# Patient Record
Sex: Male | Born: 1980 | Race: White | Hispanic: No | Marital: Married | Smoking: Light tobacco smoker
Health system: Southern US, Community
[De-identification: ages and names within clinical notes are randomized; demographics above are authoritative.]

## PROBLEM LIST (undated history)

## (undated) ENCOUNTER — Emergency Department (HOSPITAL_COMMUNITY): Admission: EM | Payer: Self-pay | Source: Home / Self Care

## (undated) DIAGNOSIS — I2510 Atherosclerotic heart disease of native coronary artery without angina pectoris: Secondary | ICD-10-CM

## (undated) DIAGNOSIS — Z789 Other specified health status: Secondary | ICD-10-CM

## (undated) DIAGNOSIS — Z720 Tobacco use: Secondary | ICD-10-CM

## (undated) DIAGNOSIS — I10 Essential (primary) hypertension: Secondary | ICD-10-CM

## (undated) DIAGNOSIS — E785 Hyperlipidemia, unspecified: Secondary | ICD-10-CM

## (undated) DIAGNOSIS — F1090 Alcohol use, unspecified, uncomplicated: Secondary | ICD-10-CM

## (undated) DIAGNOSIS — Z7289 Other problems related to lifestyle: Secondary | ICD-10-CM

## (undated) DIAGNOSIS — J449 Chronic obstructive pulmonary disease, unspecified: Secondary | ICD-10-CM

## (undated) HISTORY — DX: Tobacco use: Z72.0

## (undated) HISTORY — PX: KNEE SURGERY: SHX244

## (undated) HISTORY — DX: Atherosclerotic heart disease of native coronary artery without angina pectoris: I25.10

## (undated) HISTORY — DX: Alcohol use, unspecified, uncomplicated: F10.90

## (undated) HISTORY — DX: Hyperlipidemia, unspecified: E78.5

## (undated) HISTORY — DX: Other problems related to lifestyle: Z72.89

## (undated) HISTORY — DX: Other specified health status: Z78.9

## (undated) HISTORY — PX: HAND SURGERY: SHX662

---
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
## 1999-10-11 ENCOUNTER — Emergency Department (HOSPITAL_COMMUNITY): Admission: EM | Admit: 1999-10-11 | Discharge: 1999-10-11 | Payer: Self-pay | Admitting: Emergency Medicine

## 2000-05-15 ENCOUNTER — Emergency Department (HOSPITAL_COMMUNITY): Admission: EM | Admit: 2000-05-15 | Discharge: 2000-05-15 | Payer: Self-pay

## 2000-05-30 ENCOUNTER — Emergency Department (HOSPITAL_COMMUNITY): Admission: EM | Admit: 2000-05-30 | Discharge: 2000-05-30 | Payer: Self-pay | Admitting: *Deleted

## 2000-08-10 ENCOUNTER — Emergency Department (HOSPITAL_COMMUNITY): Admission: EM | Admit: 2000-08-10 | Discharge: 2000-08-10 | Payer: Self-pay | Admitting: *Deleted

## 2001-01-31 ENCOUNTER — Emergency Department (HOSPITAL_COMMUNITY): Admission: EM | Admit: 2001-01-31 | Discharge: 2001-01-31 | Payer: Self-pay | Admitting: Emergency Medicine

## 2001-03-29 ENCOUNTER — Encounter: Payer: Self-pay | Admitting: Emergency Medicine

## 2001-03-29 ENCOUNTER — Emergency Department (HOSPITAL_COMMUNITY): Admission: EM | Admit: 2001-03-29 | Discharge: 2001-03-29 | Payer: Self-pay | Admitting: Emergency Medicine

## 2001-09-11 ENCOUNTER — Emergency Department (HOSPITAL_COMMUNITY): Admission: EM | Admit: 2001-09-11 | Discharge: 2001-09-11 | Payer: Self-pay | Admitting: Emergency Medicine

## 2001-10-08 ENCOUNTER — Emergency Department (HOSPITAL_COMMUNITY): Admission: EM | Admit: 2001-10-08 | Discharge: 2001-10-08 | Payer: Self-pay

## 2001-10-08 ENCOUNTER — Encounter: Payer: Self-pay | Admitting: Emergency Medicine

## 2011-06-14 ENCOUNTER — Emergency Department (HOSPITAL_COMMUNITY): Payer: Self-pay

## 2011-06-14 ENCOUNTER — Emergency Department (HOSPITAL_COMMUNITY)
Admission: EM | Admit: 2011-06-14 | Discharge: 2011-06-14 | Disposition: A | Discharge status: Home / Self Care | Payer: Self-pay | Attending: Emergency Medicine | Admitting: Emergency Medicine

## 2011-06-14 ENCOUNTER — Encounter (HOSPITAL_COMMUNITY): Payer: Self-pay | Admitting: *Deleted

## 2011-06-14 DIAGNOSIS — R05 Cough: Secondary | ICD-10-CM | POA: Insufficient documentation

## 2011-06-14 DIAGNOSIS — J40 Bronchitis, not specified as acute or chronic: Secondary | ICD-10-CM | POA: Insufficient documentation

## 2011-06-14 DIAGNOSIS — K089 Disorder of teeth and supporting structures, unspecified: Secondary | ICD-10-CM | POA: Insufficient documentation

## 2011-06-14 DIAGNOSIS — K0889 Other specified disorders of teeth and supporting structures: Secondary | ICD-10-CM

## 2011-06-14 DIAGNOSIS — R059 Cough, unspecified: Secondary | ICD-10-CM | POA: Insufficient documentation

## 2011-06-14 DIAGNOSIS — K029 Dental caries, unspecified: Secondary | ICD-10-CM | POA: Insufficient documentation

## 2011-06-14 MED ORDER — HYDROCODONE-ACETAMINOPHEN 5-325 MG PO TABS: ORAL_TABLET | ORAL | Status: AC

## 2011-06-14 MED ORDER — ALBUTEROL SULFATE HFA 108 (90 BASE) MCG/ACT IN AERS
2.0000 | INHALATION_SPRAY | RESPIRATORY_TRACT | Status: AC
  Administered 2011-06-14: 2 via RESPIRATORY_TRACT
  Filled 2011-06-14: qty 6.7

## 2011-06-14 MED ORDER — ERYTHROMYCIN BASE 250 MG PO TABS: 250.0000 mg | ORAL_TABLET | Freq: Four times a day (QID) | ORAL | Status: AC

## 2011-06-14 NOTE — ED Notes (Signed)
approx one year ago had surgical removel of right lower molar. Gradual onset X5 days of pain in the area of tooth removed. Also c/o cough X2weeks causing muscle pain right abdomen. Cough keep awake at night, sinus drainage also present.

## 2011-06-14 NOTE — ED Provider Notes (Signed)
History     CSN: [id number]  Arrival date & time 06/14/11  1115   Chief Complaint  Patient presents with  . Dental Pain    where lost tooth  . Cough     HPI Pt was seen at 1210.  Per pt, c/o gradual onset and persistence of constant right lower tooth "pain" for the past several days.  Denies fevers, no intra-oral edema, no rash, no facial swelling, no dysphagia, no neck pain.   The condition is aggravated by nothing. The condition is relieved by nothing. The patient has no significant history of serious medical conditions.   Pt also c/o gradual onset and persistence of constant cough and runny/stuffy nose for the past 2 weeks.  Denies fevers, no rash, no SOB/wheezing, no CP/palpitations, no back pain, no abd pain, no N/V/D.    No past medical history on file.  No past surgical history on file.   History  Substance Use Topics  . Smoking status: Not on file  . Smokeless tobacco: Not on file  . Alcohol Use: Not on file     Review of Systems ROS: Statement: All systems negative except as marked or noted in the HPI; Constitutional: Negative for fever and chills. ; ; Eyes: Negative for eye pain and discharge. ; ; ENMT: Positive for dental caries, dental hygiene poor and toothache. +runny/stuffy nose, sinus congestion.  Negative for ear pain, bleeding gums, dental injury, facial deformity, facial swelling, hoarseness, sore throat, throat swelling and tongue swollen. ; ; Cardiovascular: Negative for chest pain, palpitations, diaphoresis, dyspnea and peripheral edema. ; ; Respiratory: +cough. Negative for wheezing and stridor. ; ; Gastrointestinal: Negative for nausea, vomiting, diarrhea and abdominal pain. ; ; Genitourinary: Negative for dysuria, flank pain and hematuria. ; ; Musculoskeletal: Negative for back pain and neck pain. ; ; Skin: Negative for rash and skin lesion. ; ; Neuro: Negative for headache, lightheadedness and neck stiffness.    Allergies  Penicillins and Vicodin  Home  Medications   Current Outpatient Rx  Name Route Sig Dispense Refill  . ALBUTEROL SULFATE HFA 108 (90 BASE) MCG/ACT IN AERS Inhalation Inhale 2 puffs into the lungs every 4 (four) hours as needed. For shortness of breath.    Marland Kitchen DIPHENHYDRAMINE HCL 25 MG PO TABS Oral Take 50 mg by mouth every 6 (six) hours as needed. For congestion.      There were no vitals taken for this visit.  Physical Exam 1215: Physical examination: Vital signs and O2 SAT: Reviewed; Constitutional: Well developed, Well nourished, Well hydrated, In no acute distress; Head and Face: Normocephalic, Atraumatic; Eyes: EOMI, PERRL, No scleral icterus; ENMT: Mouth and pharynx normal, Poor dentition, Widespread dental decay, Left TM normal, Right TM normal, Mucous membranes moist, +upper right 1st molar and lower right 2nd molar with dental decay, No gingival erythema, edema, fluctuance, or drainage.  No hoarse voice, no drooling, no stridor.  ; Neck: Supple, Full range of motion, No lymphadenopathy; Cardiovascular: Regular rate and rhythm, No murmur, rub, or gallop; Respiratory: Breath sounds clear & equal bilaterally, No rales, rhonchi, wheezes, or rub, Normal respiratory effort/excursion; Chest: Nontender, Movement normal; Extremities: Pulses normal, No tenderness, No edema; Neuro: AA&Ox3, Major CN grossly intact.  No gross focal motor or sensory deficits in extremities.; Skin: Color normal, No rash, No petechiae, Warm, Dry.   ED Course  Procedures   MDM  MDM Reviewed: nursing note and vitals Interpretation: x-ray   Dg Chest 2 View 06/14/2011  *RADIOLOGY REPORT*  Clinical  Data: Cough and congestion  CHEST - 2 VIEW  Comparison: None.  Findings: Normal mediastinum and cardiac silhouette.  Normal pulmonary  vasculature.  No evidence of effusion, infiltrate, or pneumothorax.  No acute bony abnormality.  IMPRESSION: No acute cardiopulmonary process.  Original Report Authenticated By: Genevive Bi, M.D.      1:26 PM:  MDI  given/teach and treat here.  Dx testing d/w pt.  Questions answered.  Verb understanding, agreeable to d/c home with outpt f/u.     Deloras Reichard Allison Quarry, DO 06/14/11 1942

## 2012-06-20 ENCOUNTER — Encounter (HOSPITAL_COMMUNITY): Payer: Self-pay | Admitting: Emergency Medicine

## 2012-06-20 ENCOUNTER — Emergency Department (HOSPITAL_COMMUNITY)
Admission: EM | Admit: 2012-06-20 | Discharge: 2012-06-20 | Disposition: A | Discharge status: Home / Self Care | Payer: Self-pay | Attending: Emergency Medicine | Admitting: Emergency Medicine

## 2012-06-20 ENCOUNTER — Emergency Department (HOSPITAL_COMMUNITY): Payer: Self-pay

## 2012-06-20 DIAGNOSIS — W010XXA Fall on same level from slipping, tripping and stumbling without subsequent striking against object, initial encounter: Secondary | ICD-10-CM | POA: Insufficient documentation

## 2012-06-20 DIAGNOSIS — S298XXA Other specified injuries of thorax, initial encounter: Secondary | ICD-10-CM | POA: Insufficient documentation

## 2012-06-20 DIAGNOSIS — Z79899 Other long term (current) drug therapy: Secondary | ICD-10-CM | POA: Insufficient documentation

## 2012-06-20 DIAGNOSIS — S299XXA Unspecified injury of thorax, initial encounter: Secondary | ICD-10-CM

## 2012-06-20 DIAGNOSIS — Y9289 Other specified places as the place of occurrence of the external cause: Secondary | ICD-10-CM | POA: Insufficient documentation

## 2012-06-20 DIAGNOSIS — Y9323 Activity, snow (alpine) (downhill) skiing, snow boarding, sledding, tobogganing and snow tubing: Secondary | ICD-10-CM | POA: Insufficient documentation

## 2012-06-20 DIAGNOSIS — F17200 Nicotine dependence, unspecified, uncomplicated: Secondary | ICD-10-CM | POA: Insufficient documentation

## 2012-06-20 MED ORDER — OXYCODONE-ACETAMINOPHEN 5-325 MG PO TABS: 2.0000 | ORAL_TABLET | Freq: Once | ORAL | Status: DC

## 2012-06-20 MED ORDER — OXYCODONE-ACETAMINOPHEN 5-325 MG PO TABS: 2.0000 | ORAL_TABLET | ORAL | Status: DC | PRN

## 2012-06-20 NOTE — ED Notes (Signed)
Patient transported to X-ray 

## 2012-06-20 NOTE — ED Provider Notes (Signed)
History     CSN: [id number]  Arrival date & time 06/20/12  4098   First MD Initiated Contact with Patient 06/20/12 1008      No chief complaint on file.   (Consider location/radiation/quality/duration/timing/severity/associated sxs/prior treatment) HPI Comments: Patient is a 32 year old male who presents with a 4 day history of right rib pain. The pain started suddenly after slipping and falling when he was sledding. The pain is aching and severe without radiation. Patient reports taking ibuprofen at home without relief. Deep inspiration and palpation make the pain worse. Nothing makes the pain better. No associated symptoms.    History reviewed. No pertinent past medical history.  Past Surgical History  Procedure Date  . Hand surgery     ORIF right ring finger  . Knee surgery     arthroscopy right knee    No family history on file.  History  Substance Use Topics  . Smoking status: Current Every Day Smoker -- 1.0 packs/day  . Smokeless tobacco: Not on file  . Alcohol Use: 0.6 oz/week    1 Cans of beer per week      Review of Systems  Respiratory:       Chest tenderness  All other systems reviewed and are negative.    Allergies  Penicillins and Vicodin  Home Medications   Current Outpatient Rx  Name  Route  Sig  Dispense  Refill  . BC HEADACHE POWDER PO   Oral   Take 2 packets by mouth 3 (three) times daily as needed. For pain.         . ALBUTEROL SULFATE HFA 108 (90 BASE) MCG/ACT IN AERS   Inhalation   Inhale 2 puffs into the lungs every 6 (six) hours as needed. For shortness of breath.           BP 128/86  Pulse 85  Temp 97.6 F (36.4 C) (Oral)  Resp 16  SpO2 97%  Physical Exam  Nursing note and vitals reviewed. Constitutional: He appears well-developed and well-nourished. No distress.  HENT:  Head: Normocephalic and atraumatic.  Eyes: Conjunctivae normal are normal.  Neck: Normal range of motion. Neck supple.  Cardiovascular: Normal  rate and regular rhythm.  Exam reveals no gallop and no friction rub.   No murmur heard. Pulmonary/Chest: Effort normal and breath sounds normal. He has no wheezes. He has no rales. He exhibits tenderness.       Right anterior rib tenderness to palpation. No obvious deformity or bruising noted.   Abdominal: Soft. There is no tenderness.  Musculoskeletal: Normal range of motion.  Neurological: He is alert.       Speech is goal-oriented. Moves limbs without ataxia.   Skin: Skin is warm and dry.  Psychiatric: He has a normal mood and affect. His behavior is normal.    ED Course  Procedures (including critical care time)  Labs Reviewed - No data to display Dg Ribs Unilateral W/chest Right  06/20/2012  *RADIOLOGY REPORT*  Clinical Data: Anterior rib pain after fall.  RIGHT RIBS AND CHEST - 3+ VIEW  Comparison: 06/14/2011  Findings: The lungs are clear without focal consolidation, edema, effusion or pneumothorax.  Cardiopericardial silhouette is within normal limits for size.  Imaged bony structures of the thorax are intact.  Oblique views of the right ribs show no evidence for a displaced rib fracture.  IMPRESSION: Normal exam.  No evidence for an acute right-sided rib fracture.   Original Report Authenticated By: Kennith Center, M.D.  1. Rib injury       MDM  10:50 AM Xray unremarkable for fracture. Patient will have Percocet for pain. No further evaluation needed at this time.         Emilia Beck, PA-C 06/22/12 0730

## 2012-06-20 NOTE — ED Notes (Signed)
Pt fell on right chest while sledding last week, onto concrete. C/o right anterior-lateral rib pain. C/o pain with deep breathing and cough which has worsened over the past few days.

## 2012-06-24 NOTE — ED Provider Notes (Signed)
Medical screening examination/treatment/procedure(s) were performed by non-physician practitioner and as supervising physician I was immediately available for consultation/collaboration.   Flint Melter, MD 06/24/12 1650

## 2013-03-31 ENCOUNTER — Emergency Department (HOSPITAL_COMMUNITY)
Admission: EM | Admit: 2013-03-31 | Discharge: 2013-03-31 | Disposition: A | Discharge status: Home / Self Care | Payer: No Typology Code available for payment source | Attending: Emergency Medicine | Admitting: Emergency Medicine

## 2013-03-31 ENCOUNTER — Emergency Department (HOSPITAL_COMMUNITY): Payer: No Typology Code available for payment source

## 2013-03-31 ENCOUNTER — Encounter (HOSPITAL_COMMUNITY): Payer: Self-pay | Admitting: Emergency Medicine

## 2013-03-31 DIAGNOSIS — Z79899 Other long term (current) drug therapy: Secondary | ICD-10-CM | POA: Insufficient documentation

## 2013-03-31 DIAGNOSIS — F17200 Nicotine dependence, unspecified, uncomplicated: Secondary | ICD-10-CM | POA: Insufficient documentation

## 2013-03-31 DIAGNOSIS — S92251A Displaced fracture of navicular [scaphoid] of right foot, initial encounter for closed fracture: Secondary | ICD-10-CM

## 2013-03-31 DIAGNOSIS — IMO0002 Reserved for concepts with insufficient information to code with codable children: Secondary | ICD-10-CM | POA: Insufficient documentation

## 2013-03-31 DIAGNOSIS — Y92410 Unspecified street and highway as the place of occurrence of the external cause: Secondary | ICD-10-CM | POA: Insufficient documentation

## 2013-03-31 DIAGNOSIS — S92253A Displaced fracture of navicular [scaphoid] of unspecified foot, initial encounter for closed fracture: Secondary | ICD-10-CM | POA: Insufficient documentation

## 2013-03-31 DIAGNOSIS — Z880 Allergy status to penicillin: Secondary | ICD-10-CM | POA: Insufficient documentation

## 2013-03-31 DIAGNOSIS — S92109A Unspecified fracture of unspecified talus, initial encounter for closed fracture: Secondary | ICD-10-CM | POA: Insufficient documentation

## 2013-03-31 DIAGNOSIS — S92101A Unspecified fracture of right talus, initial encounter for closed fracture: Secondary | ICD-10-CM

## 2013-03-31 DIAGNOSIS — Y9389 Activity, other specified: Secondary | ICD-10-CM | POA: Insufficient documentation

## 2013-03-31 DIAGNOSIS — Z9889 Other specified postprocedural states: Secondary | ICD-10-CM | POA: Insufficient documentation

## 2013-03-31 MED ORDER — IBUPROFEN 400 MG PO TABS
800.0000 mg | ORAL_TABLET | Freq: Once | ORAL | Status: AC
  Administered 2013-03-31: 800 mg via ORAL
  Filled 2013-03-31: qty 2

## 2013-03-31 MED ORDER — OXYCODONE-ACETAMINOPHEN 5-325 MG PO TABS: 1.0000 | ORAL_TABLET | ORAL | Status: DC | PRN
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
## 2013-03-31 NOTE — ED Provider Notes (Signed)
  Medical screening examination/treatment/procedure(s) were performed by non-physician practitioner and as supervising physician I was immediately available for consultation/collaboration.  EKG Interpretation   None          Issacc Merlo, MD 03/31/13 2355 

## 2013-03-31 NOTE — Progress Notes (Signed)
Orthopedic Tech Progress Note Patient Details:  Alejandro Gibson October 17, 1980 [id number]  Ortho Devices Type of Ortho Device: CAM walker Ortho Device/Splint Location: R LE Ortho Device/Splint Interventions: Application   Alejandro Gibson 03/31/2013, 8:13 PM

## 2013-05-16 ENCOUNTER — Emergency Department (HOSPITAL_COMMUNITY)
Admission: EM | Admit: 2013-05-16 | Discharge: 2013-05-16 | Discharge status: Against Medical Advice | Payer: No Typology Code available for payment source | Attending: Emergency Medicine | Admitting: Emergency Medicine

## 2013-05-16 ENCOUNTER — Encounter (HOSPITAL_COMMUNITY): Payer: Self-pay | Admitting: Emergency Medicine

## 2013-05-16 DIAGNOSIS — IMO0002 Reserved for concepts with insufficient information to code with codable children: Secondary | ICD-10-CM | POA: Insufficient documentation

## 2013-05-16 DIAGNOSIS — F17200 Nicotine dependence, unspecified, uncomplicated: Secondary | ICD-10-CM | POA: Insufficient documentation

## 2013-05-16 DIAGNOSIS — Y929 Unspecified place or not applicable: Secondary | ICD-10-CM | POA: Insufficient documentation

## 2013-05-16 DIAGNOSIS — Y939 Activity, unspecified: Secondary | ICD-10-CM | POA: Insufficient documentation

## 2013-05-16 DIAGNOSIS — W1809XA Striking against other object with subsequent fall, initial encounter: Secondary | ICD-10-CM | POA: Insufficient documentation

## 2013-05-16 NOTE — ED Notes (Signed)
Pt /o mid back pain after falling and hitting back on steps today

## 2013-05-16 NOTE — ED Notes (Signed)
Pt sts will go and see his ortho PCP

## 2014-06-25 ENCOUNTER — Encounter (HOSPITAL_COMMUNITY): Payer: Self-pay | Admitting: Emergency Medicine

## 2014-06-25 ENCOUNTER — Emergency Department (HOSPITAL_COMMUNITY)
Admission: EM | Admit: 2014-06-25 | Discharge: 2014-06-25 | Disposition: A | Discharge status: Home / Self Care | Payer: Self-pay | Attending: Emergency Medicine | Admitting: Emergency Medicine

## 2014-06-25 ENCOUNTER — Emergency Department (HOSPITAL_COMMUNITY): Payer: Self-pay

## 2014-06-25 DIAGNOSIS — M549 Dorsalgia, unspecified: Secondary | ICD-10-CM | POA: Insufficient documentation

## 2014-06-25 DIAGNOSIS — Z79899 Other long term (current) drug therapy: Secondary | ICD-10-CM | POA: Insufficient documentation

## 2014-06-25 DIAGNOSIS — M79651 Pain in right thigh: Secondary | ICD-10-CM | POA: Insufficient documentation

## 2014-06-25 DIAGNOSIS — Z720 Tobacco use: Secondary | ICD-10-CM | POA: Insufficient documentation

## 2014-06-25 DIAGNOSIS — X58XXXA Exposure to other specified factors, initial encounter: Secondary | ICD-10-CM | POA: Insufficient documentation

## 2014-06-25 DIAGNOSIS — H538 Other visual disturbances: Secondary | ICD-10-CM | POA: Insufficient documentation

## 2014-06-25 DIAGNOSIS — Z9889 Other specified postprocedural states: Secondary | ICD-10-CM | POA: Insufficient documentation

## 2014-06-25 DIAGNOSIS — M7981 Nontraumatic hematoma of soft tissue: Secondary | ICD-10-CM | POA: Insufficient documentation

## 2014-06-25 DIAGNOSIS — Z880 Allergy status to penicillin: Secondary | ICD-10-CM | POA: Insufficient documentation

## 2014-06-25 DIAGNOSIS — M25562 Pain in left knee: Secondary | ICD-10-CM

## 2014-06-25 LAB — BASIC METABOLIC PANEL
Anion gap: 4 — ABNORMAL LOW (ref 5–15)
BUN: 14 mg/dL (ref 6–23)
CHLORIDE: 110 mmol/L (ref 96–112)
CO2: 24 mmol/L (ref 19–32)
CREATININE: 1.01 mg/dL (ref 0.50–1.35)
Calcium: 8.5 mg/dL (ref 8.4–10.5)
GFR calc Af Amer: >90 mL/min (ref >90)
Glucose, Bld: 88 mg/dL (ref 70–99)
Potassium: 4 mmol/L (ref 3.5–5.1)
Sodium: 138 mmol/L (ref 135–145)

## 2014-06-25 LAB — CBC WITH DIFFERENTIAL/PLATELET
BASOS ABS: 0 10*3/uL (ref 0.0–0.1)
BASOS PCT: 0 % (ref 0–1)
EOS ABS: 0.1 10*3/uL (ref 0.0–0.7)
EOS PCT: 1 % (ref 0–5)
HEMATOCRIT: 47 % (ref 39.0–52.0)
Hemoglobin: 15.3 g/dL (ref 13.0–17.0)
LYMPHS ABS: 1.8 10*3/uL (ref 0.7–4.0)
LYMPHS PCT: 30 % (ref 12–46)
MCH: 29.3 pg (ref 26.0–34.0)
MCHC: 32.6 g/dL (ref 30.0–36.0)
MCV: 89.9 fL (ref 78.0–100.0)
MONO ABS: 0.6 10*3/uL (ref 0.1–1.0)
Monocytes Relative: 10 % (ref 3–12)
NEUTROS PCT: 59 % (ref 43–77)
Neutro Abs: 3.6 10*3/uL (ref 1.7–7.7)
Platelets: 221 10*3/uL (ref 150–400)
RBC: 5.23 MIL/uL (ref 4.22–5.81)
RDW: 13.6 % (ref 11.5–15.5)
WBC: 6.1 10*3/uL (ref 4.0–10.5)

## 2014-06-25 MED ORDER — NAPROXEN 500 MG PO TABS: 500.0000 mg | ORAL_TABLET | Freq: Two times a day (BID) | ORAL | Status: DC

## 2014-06-25 MED ORDER — NAPROXEN 250 MG PO TABS
500.0000 mg | ORAL_TABLET | Freq: Two times a day (BID) | ORAL | Status: DC
  Administered 2014-06-25: 500 mg via ORAL
  Filled 2014-06-25: qty 2

## 2014-06-25 NOTE — Discharge Instructions (Signed)
Bruise to the right thigh without any evidence of blood clot. For the left knee pain recommend orthopedic follow-up. Take the Naprosyn as directed for the next 7 days. Call orthopedics for follow-up. Resource guide provided below to help you find a regular doctor.   Emergency Department Resource Guide 1) Find a Doctor and Pay Out of Pocket Although you won't have to find out who is covered by your insurance plan, it is a good idea to ask around and get recommendations. You will then need to call the office and see if the doctor you have chosen will accept you as a new patient and what types of options they offer for patients who are self-pay. Some doctors offer discounts or will set up payment plans for their patients who do not have insurance, but you will need to ask so you aren't surprised when you get to your appointment.  2) Contact Your Local Health Department Not all health departments have doctors that can see patients for sick visits, but many do, so it is worth a call to see if yours does. If you don't know where your local health department is, you can check in your phone book. The CDC also has a tool to help you locate your state's health department, and many state websites also have listings of all of their local health departments.  3) Find a Walk-in Clinic If your illness is not likely to be very severe or complicated, you may want to try a walk in clinic. These are popping up all over the country in pharmacies, drugstores, and shopping centers. They're usually staffed by nurse practitioners or physician assistants that have been trained to treat common illnesses and complaints. They're usually fairly quick and inexpensive. However, if you have serious medical issues or chronic medical problems, these are probably not your best option.  No Primary Care Doctor: - Call Health Connect at  [phone] - they can help you locate a primary care doctor that  accepts your insurance, provides  certain services, etc. - Physician Referral Service- [phone]  Chronic Pain Problems: Organization         Address  Phone   Notes  [facility]  [phone] Patients need to be referred by their primary care doctor.   Medication Assistance: Organization         Address  Phone   Notes  [facility]ication [facility] [address] [place], [place] [id number] [phone]) [phone] --Must be a resident of [facility] [place] -- Must have NO insurance coverage whatsoever (no Medicaid/ Medicare, etc.) -- The pt. MUST have a primary care doctor that directs their care regularly and follows them in the community   MedAssist  [phone]) [phone]   [employer]  [phone]    Agencies that provide inexpensive medical care: Organization         Address  Phone   Notes  [facility]  [phone]) [phone]   [facility]    [phone]   [facility] [address] [place], [place] [address] [phone]   [facility] [address], [place] [phone]   Planned Parenthood    [phone]   [facility]    [phone]) [phone]   [facility]  [address], [place] Phone:  [phone]) [phone], Fax:  [phone] Hours of Operation:  9 am - 6 pm, M-F.  Also accepts Medicaid/Medicare and self-pay.  [facility]  [address], [place] Phone: [phone], Fax: [phone]. Hours of Operation:  8:30 am - 5:30 pm, M-F.  Also accepts Medicaid and self-pay.  [facility] [place] [address], [place] Phone: [phone]) [phone]   [facility] [address], [place], [place] [phone], Ext. [phone] Mondays & Thursdays: 7-9 AM.  First 15 patients are seen on a first come, first serve basis.    Medicaid-accepting [facility] Providers:  Organization         Address  Phone   Notes  [facility] [address], [place] [phone]) [phone] Also accepts self-pay patients.  [facility] [address], [place]  [phone]   [facility] [address], [place] [phone]   [facility] [address] [place] [address], [place] [phone]) [phone]   Renaye RakersVeita Bland [address], [place]   [phone] Only accepts [place] Access [place] patients after they have their name applied to their card.   Self-Pay (no insurance) in [facility] [place]:  Organization         Address  Phone   Notes  Sickle Cell Patients, [facility] [address], [place] [phone]   [facility] [address], [place] [phone]   [facility] [place]  [address], [place] [phone]   [facility]/Dr. Osei-Bonsu  [address] [place] [address], [place] or [phone], [place] [phone] Phone number for both [place] and [place] locations is the same.  [facility] [address], [place] [phone]   [facility] [place] [address] [place] [address], [place] or [address] [phone]   [facility] [address], [place] [phone]) [phone], phone; [phone](336) [phone], fax Sees patients 1st and 3rd Saturday of every month.  Must not qualify for public or private insurance (i.e. Medicaid, Medicare, [place] Health Choice, Veterans' Benefits)  Household income should be no more than 200% of the poverty level The clinic cannot treat you if you are pregnant or think you are pregnant  Sexually transmitted diseases are not treated at the clinic.    Dental Care: Organization         Address  Phone  Notes  [facility] Department of [facility] [address] [place] [address], [place] [phone]) [phone]  Accepts children up to age 36 who are enrolled in [place] or [place] Health Choice; pregnant women with a Medicaid card; and children who have applied for Medicaid or [place] Health Choice, but were declined, whose parents can pay a reduced fee at time of service.  [facility] Department of [facility] [place]  [address], [place] [phone] Accepts children up to age 36 who are enrolled in [place] or [place] Health Choice; pregnant women with a Medicaid card; and children who have applied for Medicaid or [place] Health Choice, but were declined, whose parents can pay a reduced fee at time of service.  [facility]  [address], [place] [phone] Patients are seen by appointment only. Walk-ins are not accepted. [facility] will see patients 36 years of age and older. Monday - Tuesday (  8am-5pm) Most Wednesdays (8:30-5pm) $30 per visit, cash only  [facility] Access PROGRAM  [address], [facility] [phone] Patients are seen by appointment only. Walk-ins are not accepted. [facility] will see patients 81 years of age and older. One Wednesday Evening (Monthly: Volunteer Based).  $30 per visit, cash only  [employer]  [phone] for adults; Children under age 56, call Graduate Pediatric Dentistry at [phone]. Children aged 41-14, please call [phone] to request a pediatric application.  Dental services are provided in all areas of dental care including fillings, crowns and bridges, complete and partial dentures, implants, gum treatment, root canals, and extractions. Preventive care is also provided. Treatment is provided to both adults and children. Patients are selected via a lottery and there is often a waiting list.   [facility] [address], [place]  [phone] [email or web address]   [facility] [address] [place], [place] [phone], Ext. [phone] Second  and Fourth Thursday of each month, opens at 6:30 AM; Clinic ends at 9 AM.  Patients are seen on a first-come first-served basis, and a limited number are seen during each clinic.   [facility]  [address] [place], [place] [phone]   Eligibility Requirements You must have lived in [place], [place], or [place] counties for at least the last three months.   You cannot be eligible for state or federal sponsored [place], including [employer], [place], or [employer].   You generally cannot be eligible for healthcare insurance through your employer.    How to apply: Eligibility screenings are held every Tuesday and Wednesday afternoon from 1:00 pm until 4:00 pm. You do not need an appointment for the interview!  [facility] [address] [place], [place] [phone]   [facility] Health Department  [phone]   [facility] Health Department  [phone]   [facility] Health Department  [phone]    Behavioral Health Resources in the Community: Intensive Outpatient Programs Organization         Address  Phone  Notes  [facility] Services [address] [place], [place] [phone]   [facility] Outpatient [address] [place], [place] [phone]   [facility]: Alcohol [facility] Drug S[facility] [address], [place], [place]  [phone]   [facility] Mental Health [address]  [place], [place] [phone] or [phone]   Substance Abuse Resources Organization         Address  Phone  Notes  Alcohol [facility] Drug Services  [phone]   [facility]  [phone]   [facility] [place]  [phone]   Floydene Flock  [phone]   Residential & Outpatient Substance Abuse Program  [phone]   Psychological Services Organization         Address  Phone  Notes  [facility]  [phone]   [facility] Services  [phone]   [facility] Mental  Health [address], [place] [phone] or [phone]    Mobile Crisis Teams Organization         Address  Phone  Notes  [facility], Mobile Crisis Care Unit  [phone]   [facility]  [address] [place], [place] [phone]   Doristine Locks [address] [place] [phone]    Self-Help/Support Groups Organization         Address  Phone  Notes  [facility]ental [facility] Assoc[facility] of [place] - variety of support groups  [place] Call for more information  Narcotics Anonymous (NA), Caring Services [address], [employer] [place]  2 meetings at this location   Special educational needs teacher         Address  Phone  Notes  [facility] Residential Treatment [place],    [place]  1-(330)[phone]   [facility]  [address], [place] [phone], [place], [place]   [place], [place] [phone] Admissions: 8am-3pm M-F  Incentives Substance [place] [address],    [place], [place] [phone]   [facility] [address] [place], [place], [place]   [facility] [address]  [place], [place]   Insight Programs - Intensive Outpatient [address] Kristeen [address], [place], [place]   [facility] [address].,  [place], [place] [phone] or [phone]   [facility] (RTS) [address] [place], [place] Accepts Medicaid  [facility] [address],  [place] [phone])[phone] Substance Abuse/Addiction Treatment   [facility] Organization         Address  Phone  Notes  [facility]  [phone]   Domenic Schwab, PhD [address]lis [place], [place]   [phone] or [phone]   [place], [place]  [phone]   [facility] [address], [place], [place] [phone] Insurance/Medicaid/sponsorship through [facility] and Families [address]                                    [place], [place] [phone] [place] [address], [place] [phone]    Dr. Adele Schilder  [phone]   [facility] [place] Dept. 1) [address], [place] 2) [place] 3)  [place] [address], [place] [phone]  [phone]   [place] [phone] or [phone] (After Hours)

## 2014-06-25 NOTE — ED Provider Notes (Addendum)
CSN: [id number]     Arrival date & time 06/25/14  4098 History  This chart was scribed for Alejandro Mulders, MD by Ronney Lion, ED Scribe. This patient was seen in room [facility]10/[facility]10 and the patient's care was started at 12:15 PM.      Chief Complaint  Patient presents with  . Bleeding/Bruising   The history is provided by the patient. No language interpreter was used.     HPI Comments: Alejandro Gibson is a 34 y.o. male who presents to the Emergency Department complaining of bruising easily, as he has a bruise on his right upper inner thigh with onset, last night. He first noticed it as a white, stinging, burning knot yesterday that looked like a bulging vein. He massaged it to ease the pain, and states that at one point, it felt like a pimple popping. He woke up this morning with bruising discoloration to the area. Patient complains of associated 7/10, stinging, burning pain that is most severe in the center and radiates down his leg when ambulating.   Patient also complains of swelling to his left knee that began 5 days ago. He complains of associated 6/10, throbbing pain. Exposure to cold air exacerbates his pain. Patient also complains of intermittent blurred vision, ongoing since June 2015, and back pain that has been baseline since falling on ice several years ago.   He denies fevers, chills, cough, rhinorrhea, sore throat, chest pain, SOB, abdominal pain, nausea, vomiting, diarrhea, dysuria, hematuria, leg swelling, rash, or headache.    History reviewed. No pertinent past medical history. Past Surgical History  Procedure Laterality Date  . Hand surgery      ORIF right ring finger  . Knee surgery      arthroscopy right knee   No family history on file. History  Substance Use Topics  . Smoking status: Current Every Day Smoker -- 1.00 packs/day    Types: Cigarettes  . Smokeless tobacco: Not on file  . Alcohol Use: 0.6 oz/week    1 Cans of beer per week    Review of Systems   Constitutional: Negative for fever and chills.  HENT: Negative for rhinorrhea and sore throat.   Eyes: Positive for visual disturbance.  Respiratory: Negative for cough.   Cardiovascular: Negative for chest pain and leg swelling.  Gastrointestinal: Negative for nausea, vomiting, abdominal pain and diarrhea.  Genitourinary: Negative for dysuria and hematuria.  Musculoskeletal: Positive for back pain, joint swelling and arthralgias.  Skin: Positive for color change (bruising). Negative for rash.  Neurological: Negative for headaches.  Hematological: Bruises/bleeds easily.  All other systems reviewed and are negative.     Allergies  Penicillins and Vicodin  Home Medications   Prior to Admission medications   Medication Sig Start Date End Date Taking? Authorizing Provider  naproxen (NAPROSYN) 500 MG tablet Take 1 tablet (500 mg total) by mouth 2 (two) times daily. 06/25/14   Alejandro Mulders, MD  oxyCODONE-acetaminophen (PERCOCET/ROXICET) 5-325 MG per tablet Take 1 tablet by mouth every 4 (four) hours as needed. Patient not taking: Reported on 06/25/2014 03/31/13   Garlon Hatchet, PA-C   BP 121/78 mmHg  Pulse 69  Temp(Src) 98.9 F (37.2 C)  Resp 20  Ht  (1.803 m)  Wt 200 lb (90.719 kg)  BMI 27.91 kg/m2  SpO2 100% Physical Exam  Constitutional: He is oriented to person, place, and time. He appears well-developed and well-nourished. No distress.  HENT:  Head: Normocephalic and atraumatic.  Mouth/Throat: Oropharynx is  clear and moist.  Eyes: Conjunctivae and EOM are normal. Pupils are equal, round, and reactive to light. No scleral icterus.  Neck: Neck supple. No tracheal deviation present.  Cardiovascular: Normal rate and regular rhythm.   Pulmonary/Chest: Effort normal and breath sounds normal. No respiratory distress. He has no wheezes. He has no rales.  Lungs are clear to auscultation bilaterally.  Abdominal: Soft. Bowel sounds are normal. There is no tenderness.   Musculoskeletal: Normal range of motion. He exhibits no edema.  Neurological: He is alert and oriented to person, place, and time.  Skin: Skin is warm and dry.  5 cm round bruise on right thigh that is tender to touchl no erythema.  Left knee bruise 4 cm on the medical aspect of the knee.  Psychiatric: He has a normal mood and affect. His behavior is normal.  Nursing note and vitals reviewed.   ED Course  Procedures (including critical care time)  DIAGNOSTIC STUDIES: Oxygen Saturation is 99% on room air, normal by my interpretation.    COORDINATION OF CARE: 12:18 PM - Discussed treatment plan with pt at bedside which includes [place] right leg, and pt agreed to plan. I will give patient a referral to orthopedist.    Labs Review Labs Reviewed  CBC WITH DIFFERENTIAL/PLATELET  BASIC METABOLIC PANEL   Results for orders placed or performed during the hospital encounter of 06/25/14  Basic metabolic panel  Result Value Ref Range   Sodium 138 135 - 145 mmol/L   Potassium 4.0 3.5 - 5.1 mmol/L   Chloride 110 96 - 112 mmol/L   CO2 24 19 - 32 mmol/L   Glucose, Bld 88 70 - 99 mg/dL   BUN PENDING 6 - 23 mg/dL   Creatinine, Ser PENDING 0.50 - 1.35 mg/dL   Calcium 8.5 8.4 - 29.5 mg/dL   GFR calc non Af Amer PENDING >90 mL/min   GFR calc Af Amer PENDING >90 mL/min   Anion gap 4 (L) 5 - 15  CBC with Differential/Platelet  Result Value Ref Range   WBC 6.1 4.0 - 10.5 K/uL   RBC 5.23 4.22 - 5.81 MIL/uL   Hemoglobin 15.3 13.0 - 17.0 g/dL   HCT 62.1 30.8 - 65.7 %   MCV 89.9 78.0 - 100.0 fL   MCH 29.3 26.0 - 34.0 pg   MCHC 32.6 30.0 - 36.0 g/dL   RDW 84.6 96.2 - 95.2 %   Platelets 221 150 - 400 K/uL   Neutrophils Relative % 59 43 - 77 %   Neutro Abs 3.6 1.7 - 7.7 K/uL   Lymphocytes Relative 30 12 - 46 %   Lymphs Abs 1.8 0.7 - 4.0 K/uL   Monocytes Relative 10 3 - 12 %   Monocytes Absolute 0.6 0.1 - 1.0 K/uL   Eosinophils Relative 1 0 - 5 %   Eosinophils Absolute 0.1 0.0 - 0.7 K/uL    Basophils Relative 0 0 - 1 %   Basophils Absolute 0.0 0.0 - 0.1 K/uL     Imaging Review US Venous Img Lower Unilateral Right  06/25/2014   CLINICAL DATA:  Pain and bruising right medial thigh region  EXAM: RIGHT LOWER EXTREMITY VENOUS DUPLEX ULTRASOUND  TECHNIQUE: Gray-scale sonography with graded compression, as well as color Doppler and duplex ultrasound were performed to evaluate the right lower extremity deep venous system from the level of the common femoral vein and including the common femoral, femoral, profunda femoral, popliteal and calf veins including the posterior tibial, peroneal and  gastrocnemius veins when visible. The superficial great saphenous vein was also interrogated. Spectral Doppler was utilized to evaluate flow at rest and with distal augmentation maneuvers in the common femoral, femoral and popliteal veins.  COMPARISON:  None.  FINDINGS: Contralateral Common Femoral Vein: Respiratory phasicity is normal and symmetric with the symptomatic side. No evidence of thrombus. Normal compressibility.  Common Femoral Vein: No evidence of thrombus. Normal compressibility, respiratory phasicity and response to augmentation.  Saphenofemoral Junction: No evidence of thrombus. Normal compressibility and flow on color Doppler imaging.  Profunda Femoral Vein: No evidence of thrombus. Normal compressibility and flow on color Doppler imaging.  Femoral Vein: No evidence of thrombus. Normal compressibility, respiratory phasicity and response to augmentation.  Popliteal Vein: No evidence of thrombus. Normal compressibility, respiratory phasicity and response to augmentation.  Calf Veins: No evidence of thrombus. Normal compressibility and flow on color Doppler imaging.  Superficial Great Saphenous Vein: No evidence of thrombus. Normal compressibility and flow on color Doppler imaging.  Venous Reflux:  None.  Other Findings: There is bruising in the medial right thigh region without well-defined mass   IMPRESSION: No evidence of right lower extremity deep venous thrombosis. Left common femoral vein also uptake. Bruising right medial thigh without well-defined mass.   Electronically Signed   By: Bretta Bang M.D.   On: 06/25/2014 13:53     EKG Interpretation None      MDM   Final diagnoses:  Knee pain, acute, left  Acute thigh pain, right   Labs still pending. CBC is normal. Doppler study of the right leg without evidence of DVT. No explanation for the large bruised area. Could've been a ruptured varicose vein. Patient also with long-term left knee pain that needs follow-up with orthopedics. Patient was treated with anti-inflammatories. Work note provided. Resource guide to help follow-up and find a regular doctor.  I personally performed the services described in this documentation, which was scribed in my presence. The recorded information has been reviewed and is accurate.     Alejandro Mulders, MD 06/25/14 1535   Results for orders placed or performed during the hospital encounter of 06/25/14  Basic metabolic panel  Result Value Ref Range   Sodium 138 135 - 145 mmol/L   Potassium 4.0 3.5 - 5.1 mmol/L   Chloride 110 96 - 112 mmol/L   CO2 24 19 - 32 mmol/L   Glucose, Bld 88 70 - 99 mg/dL   BUN 14 6 - 23 mg/dL   Creatinine, Ser 1.61 0.50 - 1.35 mg/dL   Calcium 8.5 8.4 - 09.6 mg/dL   GFR calc non Af Amer >90 >90 mL/min   GFR calc Af Amer >90 >90 mL/min   Anion gap 4 (L) 5 - 15  CBC with Differential/Platelet  Result Value Ref Range   WBC 6.1 4.0 - 10.5 K/uL   RBC 5.23 4.22 - 5.81 MIL/uL   Hemoglobin 15.3 13.0 - 17.0 g/dL   HCT 04.5 40.9 - 81.1 %   MCV 89.9 78.0 - 100.0 fL   MCH 29.3 26.0 - 34.0 pg   MCHC 32.6 30.0 - 36.0 g/dL   RDW 91.4 78.2 - 95.6 %   Platelets 221 150 - 400 K/uL   Neutrophils Relative % 59 43 - 77 %   Neutro Abs 3.6 1.7 - 7.7 K/uL   Lymphocytes Relative 30 12 - 46 %   Lymphs Abs 1.8 0.7 - 4.0 K/uL   Monocytes Relative 10 3 - 12 %    Monocytes  Absolute 0.6 0.1 - 1.0 K/uL   Eosinophils Relative 1 0 - 5 %   Eosinophils Absolute 0.1 0.0 - 0.7 K/uL   Basophils Relative 0 0 - 1 %   Basophils Absolute 0.0 0.0 - 0.1 K/uL    Electrolytes are normal. Patient stable for discharge home.  Alejandro MuldersScott Seletha Zimmermann, MD 06/25/14 1538

## 2014-06-25 NOTE — ED Notes (Signed)
Pt c/o right upper inner thigh pain and swelling last night. Pt states a "knot" came up and he massaged it until it felt better. Pt reports right inner thigh is now bruised/red and tender to touch. Pt also c/o intermittent left knee pain and swelling x 4 days.

## 2014-10-20 ENCOUNTER — Emergency Department (HOSPITAL_COMMUNITY): Payer: Medicaid Other

## 2014-10-20 ENCOUNTER — Emergency Department (HOSPITAL_COMMUNITY)
Admission: EM | Admit: 2014-10-20 | Discharge: 2014-10-20 | Discharge status: Against Medical Advice | Payer: Medicaid Other | Attending: Emergency Medicine | Admitting: Emergency Medicine

## 2014-10-20 ENCOUNTER — Encounter (HOSPITAL_COMMUNITY): Payer: Self-pay | Admitting: *Deleted

## 2014-10-20 DIAGNOSIS — S6991XA Unspecified injury of right wrist, hand and finger(s), initial encounter: Secondary | ICD-10-CM | POA: Diagnosis present

## 2014-10-20 DIAGNOSIS — Y9339 Activity, other involving climbing, rappelling and jumping off: Secondary | ICD-10-CM | POA: Diagnosis not present

## 2014-10-20 DIAGNOSIS — Z880 Allergy status to penicillin: Secondary | ICD-10-CM | POA: Insufficient documentation

## 2014-10-20 DIAGNOSIS — Z791 Long term (current) use of non-steroidal anti-inflammatories (NSAID): Secondary | ICD-10-CM | POA: Diagnosis not present

## 2014-10-20 DIAGNOSIS — S63262A Dislocation of metacarpophalangeal joint of right middle finger, initial encounter: Secondary | ICD-10-CM | POA: Diagnosis not present

## 2014-10-20 DIAGNOSIS — Y9289 Other specified places as the place of occurrence of the external cause: Secondary | ICD-10-CM | POA: Insufficient documentation

## 2014-10-20 DIAGNOSIS — S63259A Unspecified dislocation of unspecified finger, initial encounter: Secondary | ICD-10-CM

## 2014-10-20 DIAGNOSIS — W231XXA Caught, crushed, jammed, or pinched between stationary objects, initial encounter: Secondary | ICD-10-CM | POA: Diagnosis not present

## 2014-10-20 DIAGNOSIS — Y998 Other external cause status: Secondary | ICD-10-CM | POA: Insufficient documentation

## 2014-10-20 DIAGNOSIS — Z720 Tobacco use: Secondary | ICD-10-CM | POA: Diagnosis not present

## 2014-10-20 MED ORDER — LIDOCAINE HCL (PF) 2 % IJ SOLN
INTRAMUSCULAR | Status: DC
Start: 2014-10-20 — End: 2014-10-21
  Filled 2014-10-20: qty 10

## 2014-10-20 MED ORDER — LIDOCAINE HCL (PF) 2 % IJ SOLN
10.0000 mL | Freq: Once | INTRAMUSCULAR | Status: AC
  Administered 2014-10-20: 10 mL

## 2014-10-20 NOTE — ED Notes (Signed)
PA Hobson Bryant at bedside. 

## 2014-10-20 NOTE — ED Notes (Signed)
Pt not in room when radiology arrived to take patient to xray. Per registration, he had walked out a few minutes prior. Unable to locate pt outside. PA Link SnufferHobson made aware. Pt left AMA.

## 2014-10-20 NOTE — ED Provider Notes (Signed)
CSN: [id number]     Arrival date & time 10/20/14  2030 History   First MD Initiated Contact with Patient 10/20/14 2102     Chief Complaint  Patient presents with  . Finger Injury     (Consider location/radiation/quality/duration/timing/severity/associated sxs/prior Treatment) Patient is a 34 y.o. male presenting with hand pain. The history is provided by the patient.  Hand Pain This is a new problem. The current episode started today. The problem occurs constantly. The problem has been gradually worsening. Associated symptoms include arthralgias. Pertinent negatives include no abdominal pain, chest pain, coughing or neck pain. Exacerbated by: palpation and movement of the right middle finger. He has tried nothing for the symptoms. The treatment provided no relief.    History reviewed. No pertinent past medical history. Past Surgical History  Procedure Laterality Date  . Hand surgery      ORIF right ring finger  . Knee surgery      arthroscopy right knee   No family history on file. History  Substance Use Topics  . Smoking status: Current Every Day Smoker -- 1.00 packs/day    Types: Cigarettes  . Smokeless tobacco: Not on file  . Alcohol Use: 0.6 oz/week    1 Cans of beer per week    Review of Systems  Constitutional: Negative for activity change.       All ROS Neg except as noted in HPI  HENT: Negative for nosebleeds.   Eyes: Negative for photophobia and discharge.  Respiratory: Negative for cough, shortness of breath and wheezing.   Cardiovascular: Negative for chest pain and palpitations.  Gastrointestinal: Negative for abdominal pain and blood in stool.  Genitourinary: Negative for dysuria, frequency and hematuria.  Musculoskeletal: Positive for arthralgias. Negative for back pain and neck pain.  Skin: Negative.   Neurological: Negative for dizziness, seizures and speech difficulty.  Psychiatric/Behavioral: Negative for hallucinations and confusion.      Allergies   Penicillins and Vicodin  Home Medications   Prior to Admission medications   Medication Sig Start Date End Date Taking? Authorizing Provider  naproxen (NAPROSYN) 500 MG tablet Take 1 tablet (500 mg total) by mouth 2 (two) times daily. 06/25/14   Vanetta Mulders, MD  oxyCODONE-acetaminophen (PERCOCET/ROXICET) 5-325 MG per tablet Take 1 tablet by mouth every 4 (four) hours as needed. Patient not taking: Reported on 06/25/2014 03/31/13   Garlon Hatchet, PA-C   BP 129/66 mmHg  Pulse 90  Temp(Src) 98 F (36.7 C) (Oral)  Resp 22  Ht  (1.803 m)  Wt 230 lb (104.327 kg)  BMI 32.09 kg/m2  SpO2 98% Physical Exam  Constitutional: He is oriented to person, place, and time. He appears well-developed and well-nourished.  Non-toxic appearance.  HENT:  Head: Normocephalic.  Right Ear: Tympanic membrane and external ear normal.  Left Ear: Tympanic membrane and external ear normal.  Eyes: EOM and lids are normal. Pupils are equal, round, and reactive to light.  Neck: Normal range of motion. Neck supple. Carotid bruit is not present.  Cardiovascular: Normal rate, regular rhythm, normal heart sounds, intact distal pulses and normal pulses.   Pulmonary/Chest: Breath sounds normal. No respiratory distress.  Abdominal: Soft. Bowel sounds are normal. There is no tenderness. There is no guarding.  Musculoskeletal: Normal range of motion.  Deformity of the PIP joint. Cap refill less than 2 sec. No deformity or swelling the other fingers of the right hand. FROM of the right elbow, wrist and shoulder.  Lymphadenopathy:  Head (right side): No submandibular adenopathy present.       Head (left side): No submandibular adenopathy present.    He has no cervical adenopathy.  Neurological: He is alert and oriented to person, place, and time. He has normal strength. No cranial nerve deficit or sensory deficit.  Skin: Skin is warm and dry.  Psychiatric: He has a normal mood and affect. His speech is  normal.  Nursing note and vitals reviewed.   ED Course  ORTHOPEDIC INJURY TREATMENT Date/Time: 10/25/2014 12:11 PM Performed by: Ivery QualeBRYANT, Yunior Jain Authorized by: Ivery QualeBRYANT, Nyjae Hodge Consent: Verbal consent obtained. Risks and benefits: risks, benefits and alternatives were discussed Consent given by: patient Patient understanding: patient states understanding of the procedure being performed Patient identity confirmed: arm band Time out: Immediately prior to procedure a "time out" was called to verify the correct patient, procedure, equipment, support staff and site/side marked as required. Injury location: finger Location details: right long finger Injury type: dislocation Dislocation type: PIP Pre-procedure neurovascular assessment: neurovascularly intact Pre-procedure distal perfusion: normal Pre-procedure neurological function: normal Pre-procedure range of motion: reduced Local anesthesia used: yes Anesthesia: hematoma block Local anesthetic: lidocaine 1% without epinephrine Patient sedated: no Manipulation performed: yes Reduction successful: yes X-ray confirmed reduction: Pt left ED before confirmation could be obtained. Immobilization: Pt left ED before splint could be placed. Post-procedure neurovascular assessment: post-procedure neurovascularly intact Post-procedure distal perfusion: normal Post-procedure neurological function: normal Post-procedure range of motion: improved Patient tolerance: Patient tolerated the procedure well with no immediate complications   (including critical care time) Labs Review Labs Reviewed - No data to display  Imaging Review No results found.   EKG Interpretation None      MDM  Pt with good alignment of the right middle finger by visualization. Pt left ED AMA before confirmation xray or splint applied.   Final diagnoses:  None    *I have reviewed nursing notes, vital signs, and all appropriate lab and imaging results for this  patient.[address]**    Arash Karstens, PA-C 10/25/14 1214  Rolland PorterMark James, MD 11/02/14 [id number]

## 2014-10-20 NOTE — ED Notes (Addendum)
Pt was jumping from a rope swing when his right middle finger became caught in the rope, pt is able to feel sensation to his finger, deformity noted to right middle finger, pt admits to drinking 3 beers today,

## 2015-03-05 ENCOUNTER — Encounter (HOSPITAL_COMMUNITY): Payer: Self-pay | Admitting: Emergency Medicine

## 2015-03-05 ENCOUNTER — Emergency Department (HOSPITAL_COMMUNITY)
Admission: EM | Admit: 2015-03-05 | Discharge: 2015-03-05 | Disposition: A | Discharge status: Home / Self Care | Payer: Medicaid Other | Attending: Emergency Medicine | Admitting: Emergency Medicine

## 2015-03-05 DIAGNOSIS — M25561 Pain in right knee: Secondary | ICD-10-CM | POA: Diagnosis not present

## 2015-03-05 DIAGNOSIS — Z880 Allergy status to penicillin: Secondary | ICD-10-CM | POA: Insufficient documentation

## 2015-03-05 DIAGNOSIS — M25562 Pain in left knee: Secondary | ICD-10-CM | POA: Insufficient documentation

## 2015-03-05 DIAGNOSIS — Z720 Tobacco use: Secondary | ICD-10-CM | POA: Insufficient documentation

## 2015-03-05 MED ORDER — MELOXICAM 7.5 MG PO TABS: 7.5000 mg | ORAL_TABLET | Freq: Every day | ORAL | Status: DC

## 2015-03-05 NOTE — ED Notes (Signed)
C/o bilateral knee pain x 2-3 years. 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 Describes as "burning pain".

## 2015-03-05 NOTE — ED Provider Notes (Signed)
CSN: [id number]     Arrival date & time 03/05/15  0920 History  By signing my name below, I, Elon Spanner, attest that this documentation has been prepared under the direction and in the presence of Kyren Vaux Camprubi-Soms, PA-C. Electronically Signed: Elon Spanner ED Scribe. 03/05/2015. 9:22 AM.    No chief complaint on file.  Patient is a 34 y.o. male presenting with knee pain. The history is provided by the patient. No language interpreter was used.  Knee Pain Location:  Knee Injury: no   Knee location:  L knee and R knee Pain details:    Quality:  Aching   Radiates to:  Does not radiate   Severity:  Mild   Onset quality:  Gradual   Duration: years.   Timing:  Intermittent   Progression:  Worsening Chronicity:  Chronic Dislocation: no   Prior injury to area:  No Relieved by: marijuana. 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 ) Associated symptoms: stiffness (occasionally)   Associated symptoms: no decreased ROM, no fever, no muscle weakness, no numbness, no swelling and no tingling     HPI Comments: BRONSON BRESSMAN is a 34 y.o. male who presents to the Emergency Department complaining of intermittent, worsening, aching bilateral knee pain onset 2 years ago without injury; worse with weather changes, aching/stiff in nature, nonradiating, currently 7/10, worse with movements required to operate the pedal of a forklift; unrelieved by ibuprofen and ice; relieved by marijuana.  Associated symptoms included intermittent swelling which is currently resolved.  The patient denies any injury.  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  He denies fever, chills, CP, SOB, abdominal pain, n/v/d/c, dysuria, hematuria, numbness, tingling, weakness, bruising, erythema, or warmth.   No past medical history on  file. Past Surgical History  Procedure Laterality Date  . Hand surgery      ORIF right ring finger  . Knee surgery      arthroscopy right knee   No family history on file. Social History  Substance Use Topics  . Smoking status: Current Every Day Smoker -- 1.00 packs/day    Types: Cigarettes  . Smokeless tobacco: Not on file  . Alcohol Use: 0.6 oz/week    1 Cans of beer per week    Review of Systems  Constitutional: Negative for fever and chills.  Respiratory: Negative for shortness of breath.   Cardiovascular: Negative for chest pain.  Gastrointestinal: Negative for nausea, vomiting, abdominal pain, diarrhea and constipation.  Genitourinary: Negative for dysuria and hematuria.  Musculoskeletal: Positive for arthralgias and stiffness (occasionally). Negative for myalgias and joint swelling.  Skin: Negative for color change.  Allergic/Immunologic: Negative for immunocompromised state.  Neurological: Negative for weakness and numbness.  Psychiatric/Behavioral: Negative for confusion.  10 Systems reviewed and all are negative for acute change except as noted in the HPI.  Allergies  Penicillins and Vicodin  Home Medications   Prior to Admission medications   Medication Sig Start Date End Date Taking? Authorizing Provider  naproxen (NAPROSYN) 500 MG tablet Take 1 tablet (500 mg total) by mouth 2 (two) times daily. Patient not taking: Reported on 10/20/2014 06/25/14   Vanetta Mulders, MD  oxyCODONE-acetaminophen (PERCOCET/ROXICET) 5-325 MG per tablet Take 1 tablet by mouth every 4 (four) hours as needed. Patient not taking: Reported on 06/25/2014  03/31/13   Garlon Hatchet, PA-C   BP 146/79 mmHg  Pulse 83  Temp(Src) 97.7 F (36.5 C) (Oral)  Resp 18  Ht  (1.803 m)  Wt 220 lb (99.791 kg)  BMI 30.70 kg/m2  SpO2 100% Physical Exam  Constitutional: He is oriented to person, place, and time. Vital signs are normal. He appears well-developed and well-nourished.  Non-toxic  appearance. No distress.  Afebrile, nontoxic, NAD  HENT:  Head: Normocephalic and atraumatic.  Mouth/Throat: Mucous membranes are normal.  Eyes: Conjunctivae and EOM are normal. Right eye exhibits no discharge. Left eye exhibits no discharge.  Neck: Normal range of motion. Neck supple.  Cardiovascular: Normal rate and intact distal pulses.   Pulmonary/Chest: Effort normal. No respiratory distress.  Abdominal: Normal appearance. He exhibits no distension.  Musculoskeletal: Normal range of motion.       Right knee: He exhibits normal range of motion, no swelling, no effusion, no deformity, no erythema, normal alignment, no LCL laxity, normal patellar mobility and no MCL laxity. Tenderness found. Lateral joint line tenderness noted.       Left knee: He exhibits normal range of motion, no swelling, no effusion, no deformity, no erythema, normal alignment, no LCL laxity, normal patellar mobility and no MCL laxity. Tenderness found. Lateral joint line tenderness noted.  bilatera knees with FROM intact, with mild lateral joint line TTP, no swelling/effusion/deformity, no bruising or erythema, no warmth, no abnormal alignment or patellar mobility, no varus/valgus laxity, neg anterior drawer test, no crepitus.  Strength and sensation grossly intact.  Distal pulses intact.  Gait steady and nonantalgic.    Neurological: He is alert and oriented to person, place, and time. He has normal strength. No sensory deficit.  Skin: Skin is warm, dry and intact. No rash noted.  Psychiatric: He has a normal mood and affect.  Nursing note and vitals reviewed.   ED Course  Procedures (including critical care time)  DIAGNOSTIC STUDIES: Oxygen Saturation is 100% on RA, normal by my interpretation.    COORDINATION OF CARE:  9:37 AM Will prescribe Mobic and refer to orthopaedist.  Patient acknowledges and agrees with plan.    Labs Review Labs Reviewed - No data to display  Imaging Review No results  found.    EKG Interpretation None      MDM   Final diagnoses:  Bilateral knee pain    34 y.o. male here with chronic b/l knee pain, spent years on his knees for work, now has pain worse with weather changes. NVI with soft compartments. Mildly tender to lateral joint lines bilaterally but given no trauma or injury, doubt need for xray imaging today. Will start on mobic for likely OA and refer to ortho for ongoing management. Discussed heat and elevation for pain relief. I explained the diagnosis and have given explicit precautions to return to the ER including for any other new or worsening symptoms. The patient understands and accepts the medical plan as it's been dictated and I have answered their questions. Discharge instructions concerning home care and prescriptions have been given. The patient is STABLE and is discharged to home in good condition.   I personally performed the services described in this documentation, which was scribed in my presence. The recorded information has been reviewed and is accurate.  BP 146/79 mmHg  Pulse 83  Temp(Src) 97.7 F (36.5 C) (Oral)  Resp 18  Ht  (1.803 m)  Wt 220 lb (99.791 kg)  BMI 30.70 kg/m2  SpO2  100%  Meds ordered this encounter  Medications  . meloxicam (MOBIC) 7.5 MG tablet    Sig: Take 1 tablet (7.5 mg total) by mouth daily.    Dispense:  30 tablet    Refill:  0    Order Specific Question:  Supervising Provider    Answer:  Eber HongMILLER, [name] [[id number]]      Jobani Sabado Camprubi-Soms, PA-C 03/05/15 [id number]  Leta BaptistEmily Roe Nguyen, MD 03/05/15 [id number]

## 2015-03-05 NOTE — Discharge Instructions (Signed)
Use heat for 20 minutes at a time every hour especially at night after work, and elevate knee throughout the day, to help with pain. Use mobic as directed to help with pain. Call orthopedic follow up today or tomorrow to schedule followup appointment in 2-3 weeks for ongoing management of your symptoms. Return to the ER for changes or worsening symptoms.   Knee Pain Knee pain is a very common symptom and can have many causes. Knee pain often goes away when you follow your health care provider's instructions for relieving pain and discomfort at home. However, knee pain can develop into a condition that needs treatment. Some conditions may include:  Arthritis caused by wear and tear (osteoarthritis).  Arthritis caused by swelling and irritation (rheumatoid arthritis or gout).  A cyst or growth in your knee.  An infection in your knee joint.  An injury that will not heal.  Damage, swelling, or irritation of the tissues that support your knee (torn ligaments or tendinitis). If your knee pain continues, additional tests may be ordered to diagnose your condition. Tests may include X-rays or other imaging studies of your knee. You may also need to have fluid removed from your knee. Treatment for ongoing knee pain depends on the cause, but treatment may include:  Medicines to relieve pain or swelling.  Steroid injections in your knee.  Physical therapy.  Surgery. HOME CARE INSTRUCTIONS  Take medicines only as directed by your health care provider.  Rest your knee and keep it raised (elevated) while you are resting.  Do not do things that cause or worsen pain.  Avoid high-impact activities or exercises, such as running, jumping rope, or doing jumping jacks.  Apply ice to the knee area:  Put ice in a plastic bag.  Place a towel between your skin and the bag.  Leave the ice on for 20 minutes, 2-3 times a day.  Ask your health care provider if you should wear an elastic knee  support.  Keep a pillow under your knee when you sleep.  Lose weight if you are overweight. Extra weight can put pressure on your knee.  Do not use any tobacco products, including cigarettes, chewing tobacco, or electronic cigarettes. If you need help quitting, ask your health care provider. Smoking may slow the healing of any bone and joint problems that you may have. SEEK MEDICAL CARE IF:  Your knee pain continues, changes, or gets worse.  You have a fever along with knee pain.  Your knee buckles or locks up.  Your knee becomes more swollen. SEEK IMMEDIATE MEDICAL CARE IF:   Your knee joint feels hot to the touch.  You have chest pain or trouble breathing.   This information is not intended to replace advice given to you by your health care provider. Make sure you discuss any questions you have with your health care provider.   Document Released: 03/01/2007 Document Revised: 05/25/2014 Document Reviewed: 12/18/2013 Elsevier Interactive Patient Education 2016 Elsevier Inc.  [employer] Therapy Heat therapy can help ease sore, stiff, injured, and tight muscles and joints. Heat relaxes your muscles, which may help ease your pain. Heat therapy should only be used on old, pre-existing, or long-lasting (chronic) injuries. Do not use heat therapy unless told by your doctor. HOW TO USE HEAT THERAPY There are several different kinds of heat therapy, including:  Moist heat pack.  Warm water bath.  Hot water bottle.  Electric heating pad.  Heated gel pack.  Heated wrap.  Electric heating  pad. GENERAL HEAT THERAPY RECOMMENDATIONS   Do not sleep while using heat therapy. Only use heat therapy while you are awake.  Your skin may turn pink while using heat therapy. Do not use heat therapy if your skin turns red.  Do not use heat therapy if you have new pain.  High heat or long exposure to heat can cause burns. Be careful when using heat therapy to avoid burning your skin.  Do not  use heat therapy on areas of your skin that are already irritated, such as with a rash or sunburn. GET HELP IF:   You have blisters, redness, swelling (puffiness), or numbness.  You have new pain.  Your pain is worse. MAKE SURE YOU:  Understand these instructions.  Will watch your condition.  Will get help right away if you are not doing well or get worse.   This information is not intended to replace advice given to you by your health care provider. Make sure you discuss any questions you have with your health care provider.   Document Released: 07/27/2011 Document Revised: 05/25/2014 Document Reviewed: 06/27/2013 Elsevier Interactive Patient Education [employer] Elsevier Inc.

## 2015-03-26 ENCOUNTER — Emergency Department (HOSPITAL_COMMUNITY)
Admission: EM | Admit: 2015-03-26 | Discharge: 2015-03-26 | Disposition: A | Discharge status: Home / Self Care | Payer: Medicaid Other | Attending: Emergency Medicine | Admitting: Emergency Medicine

## 2015-03-26 ENCOUNTER — Encounter (HOSPITAL_COMMUNITY): Payer: Self-pay | Admitting: *Deleted

## 2015-03-26 DIAGNOSIS — Z791 Long term (current) use of non-steroidal anti-inflammatories (NSAID): Secondary | ICD-10-CM | POA: Insufficient documentation

## 2015-03-26 DIAGNOSIS — G44039 Episodic paroxysmal hemicrania, not intractable: Secondary | ICD-10-CM | POA: Diagnosis not present

## 2015-03-26 DIAGNOSIS — Z880 Allergy status to penicillin: Secondary | ICD-10-CM | POA: Insufficient documentation

## 2015-03-26 DIAGNOSIS — Z720 Tobacco use: Secondary | ICD-10-CM | POA: Diagnosis not present

## 2015-03-26 DIAGNOSIS — R51 Headache: Secondary | ICD-10-CM | POA: Diagnosis present

## 2015-03-26 MED ORDER — METOCLOPRAMIDE HCL 5 MG/ML IJ SOLN
10.0000 mg | Freq: Once | INTRAMUSCULAR | Status: AC
  Administered 2015-03-26: 10 mg via INTRAVENOUS
  Filled 2015-03-26: qty 2

## 2015-03-26 MED ORDER — DIPHENHYDRAMINE HCL 50 MG/ML IJ SOLN
25.0000 mg | Freq: Once | INTRAMUSCULAR | Status: AC
  Administered 2015-03-26: 25 mg via INTRAVENOUS
  Filled 2015-03-26: qty 1

## 2015-03-26 MED ORDER — ALBUTEROL SULFATE HFA 108 (90 BASE) MCG/ACT IN AERS
2.0000 | INHALATION_SPRAY | RESPIRATORY_TRACT | Status: DC | PRN
  Administered 2015-03-26: 2 via RESPIRATORY_TRACT
  Filled 2015-03-26: qty 6.7

## 2015-03-26 MED ORDER — DEXAMETHASONE SODIUM PHOSPHATE 10 MG/ML IJ SOLN
10.0000 mg | Freq: Once | INTRAMUSCULAR | Status: AC
  Administered 2015-03-26: 10 mg via INTRAVENOUS
  Filled 2015-03-26: qty 1

## 2015-03-26 NOTE — ED Provider Notes (Signed)
CSN: [id number]     Arrival date & time 03/26/15  0941 History   First MD Initiated Contact with Patient 03/26/15 1000     Chief Complaint  Patient presents with  . Headache     (Consider location/radiation/quality/duration/timing/severity/associated sxs/prior Treatment) HPI Comments: Patient presents with a headache. He states that he had a headache that started 4 days ago. It was gradually worsening throughout today. He's taken tramadol and goody powders and does have some improvement of symptoms but he still has what he calls a bad headache. He states it's behind both of his eyes. He had some green nasal discharge when it started on Thursday but denies any ongoing nasal congestion or drainage. He denies any nausea or vomiting. He denies any ataxia. There is no numbness or weakness to his extremities. No recent head injuries. He has a history of migraines as a child that hasn't had any recently. He denies any neck pain or fevers. He thinks the headaches may be related to his eyes. He's trying to get a referral to see an eye doctor. He notes blurry vision in his left eye this been going on about a year. This is unchanged but he occasionally sees spots in both of his eyes. Denies any eye pain.  Patient is a 34 y.o. male presenting with headaches.  Headache Associated symptoms: no abdominal pain, no back pain, no congestion, no cough, no diarrhea, no dizziness, no fatigue, no fever, no nausea, no numbness, no vomiting and no weakness     History reviewed. No pertinent past medical history. Past Surgical History  Procedure Laterality Date  . Hand surgery      ORIF right ring finger  . Knee surgery      arthroscopy right knee   History reviewed. No pertinent family history. Social History  Substance Use Topics  . Smoking status: Current Every Day Smoker -- 1.00 packs/day    Types: Cigarettes  . Smokeless tobacco: None  . Alcohol Use: 0.6 oz/week    1 Cans of beer per week    Review of  Systems  Constitutional: Negative for fever, chills, diaphoresis and fatigue.  HENT: Negative for congestion, rhinorrhea and sneezing.   Eyes: Negative.   Respiratory: Negative for cough, chest tightness and shortness of breath.   Cardiovascular: Negative for chest pain and leg swelling.  Gastrointestinal: Negative for nausea, vomiting, abdominal pain, diarrhea and blood in stool.  Genitourinary: Negative for frequency, hematuria, flank pain and difficulty urinating.  Musculoskeletal: Negative for back pain and arthralgias.  Skin: Negative for rash.  Neurological: Positive for headaches. Negative for dizziness, speech difficulty, weakness and numbness.      Allergies  Penicillins and Vicodin  Home Medications   Prior to Admission medications   Medication Sig Start Date End Date Taking? Authorizing Provider  ibuprofen (ADVIL,MOTRIN) 800 MG tablet Take 800 mg by mouth every 8 (eight) hours as needed (pain).   Yes Historical Provider, MD  meloxicam (MOBIC) 7.5 MG tablet Take 1 tablet (7.5 mg total) by mouth daily. 03/05/15  Yes Mercedes Camprubi-Soms, PA-C  traMADol (ULTRAM) 50 MG tablet Take 50 mg by mouth every 8 (eight) hours as needed (pain).   Yes Historical Provider, MD   BP 148/83 mmHg  Pulse 88  Temp(Src) 98.1 F (36.7 C) (Oral)  Resp 16  Ht 5\' 11"  (1.803 m)  Wt 220 lb (99.791 kg)  BMI 30.70 kg/m2  SpO2 96% Physical Exam  Constitutional: He is oriented to person, place, and time. He  appears well-developed and well-nourished.  HENT:  Head: Normocephalic and atraumatic.  Eyes: Conjunctivae and EOM are normal. Pupils are equal, round, and reactive to light.  No photophobia, disk margins clear  Neck: Normal range of motion. Neck supple.  Cardiovascular: Normal rate, regular rhythm and normal heart sounds.   Pulmonary/Chest: Effort normal and breath sounds normal. No respiratory distress. He has no wheezes. He has no rales. He exhibits no tenderness.  Abdominal: Soft.  Bowel sounds are normal. There is no tenderness. There is no rebound and no guarding.  Musculoskeletal: Normal range of motion. He exhibits no edema.  Lymphadenopathy:    He has no cervical adenopathy.  Neurological: He is alert and oriented to person, place, and time. He has normal strength. No cranial nerve deficit or sensory deficit. GCS eye subscore is 4. GCS verbal subscore is 5. GCS motor subscore is 6.  FTN intact, no pronator drift  Skin: Skin is warm and dry. No rash noted.  Psychiatric: He has a normal mood and affect.    ED Course  Procedures (including critical care time) Labs Review Labs Reviewed - No data to display  Imaging Review No results found. I have personally reviewed and evaluated these images and lab results as part of my medical decision-making.   EKG Interpretation None      MDM   Final diagnoses:  Nonintractable paroxysmal hemicrania, unspecified chronicity pattern    Patient presents with a frontal type headache. He was given migraine cocktail and is feeling much better. He doesn't have any neurologic deficits or symptoms that be more suggestive of subarachnoid hemorrhage or meningitis. His vision seems to be at baseline. His visual acuity was noted on documentation to be worse on the right however patient states that the person who obtained a visual acuity got mixed up and the worse vision was actually on the left which is baseline for him. He was discharged home in good condition. I did give him an outpatient referral to ophthalmology. Return precautions were given.    Rolan Bucco, MD 03/26/15 [phone]

## 2015-03-26 NOTE — ED Notes (Signed)
C/o headache onset Thurs . behin d his eyes c/o seeing spots at times . States he needs a referal for eye doctors appoointment

## 2016-01-04 IMAGING — US US EXTREM LOW VENOUS*R*
1 series · 13 of 24 positions shown · non-contrast
Comparison: None.

CLINICAL DATA: Pain and bruising right medial thigh region

EXAM:
RIGHT LOWER EXTREMITY VENOUS DUPLEX ULTRASOUND
TECHNIQUE: Gray-scale sonography with graded compression, as well as color
Doppler and duplex ultrasound were performed to evaluate the right
lower extremity deep venous system from the level of the common
femoral vein and including the common femoral, femoral, profunda
femoral, popliteal and calf veins including the posterior tibial,
peroneal and gastrocnemius veins when visible. The superficial great
saphenous vein was also interrogated. Spectral Doppler was utilized
to evaluate flow at rest and with distal augmentation maneuvers in
the common femoral, femoral and popliteal veins.

[Series 1: us extrem low venous*right* · 0.08mm/px · 13 of 50 slices shown]
[im 1/50]
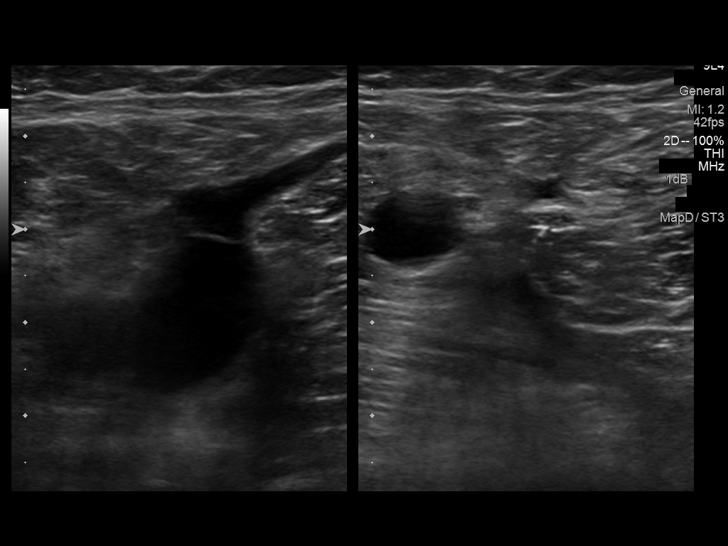
[im 5/50]
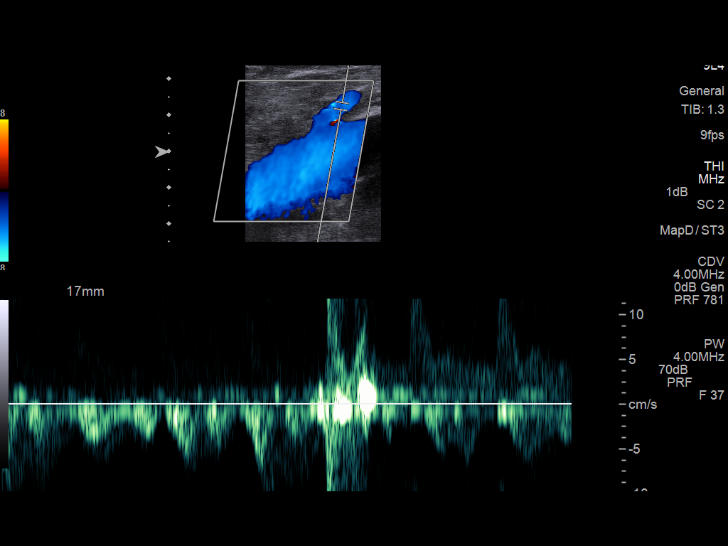
[im 9/50]
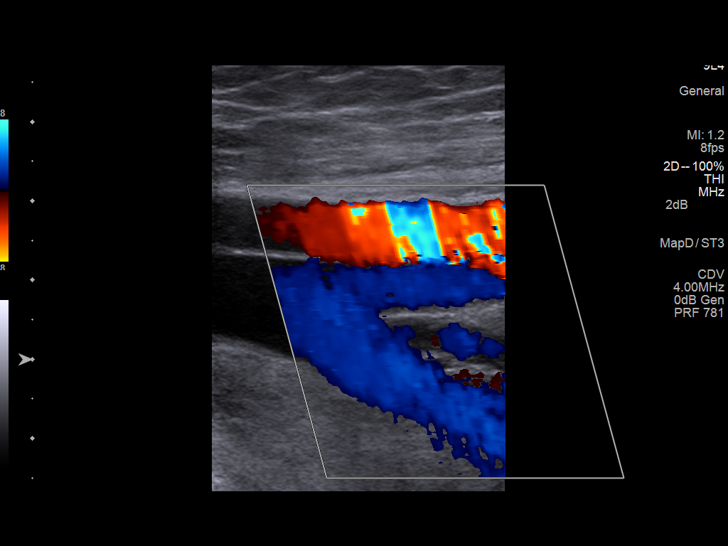
[im 13/50]
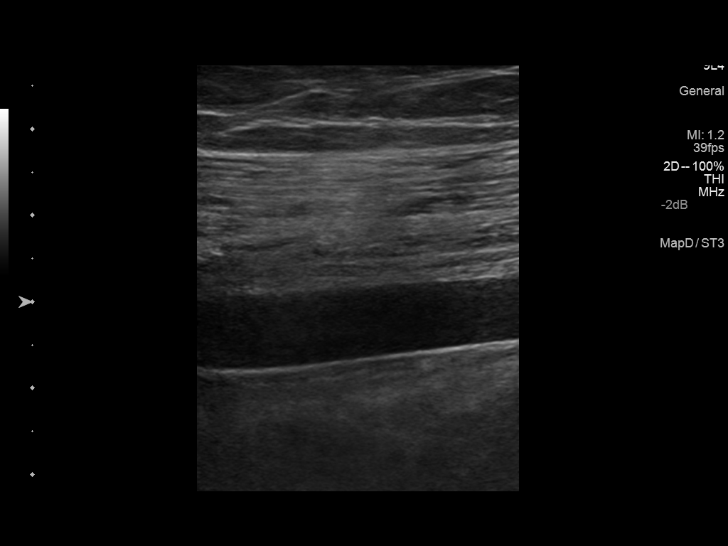
[im 18/50]
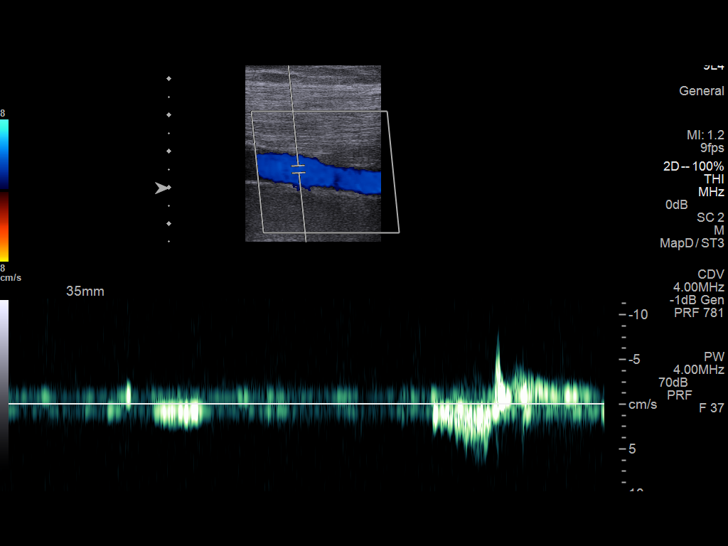
[im 22/50]
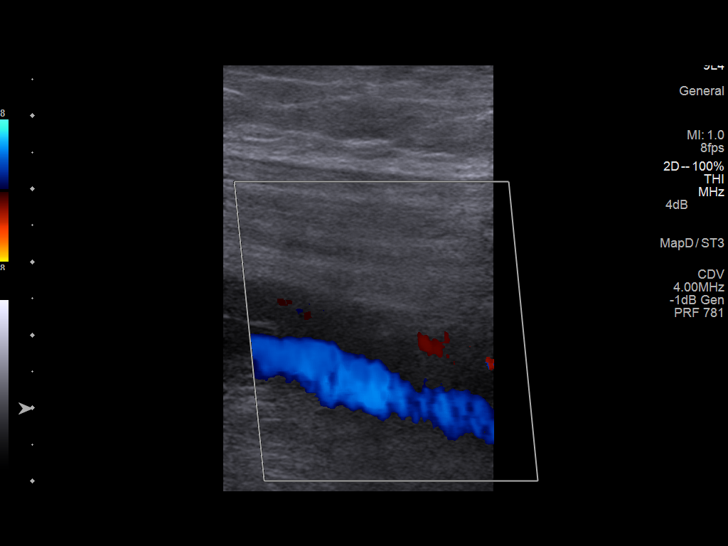
[im 26/50]
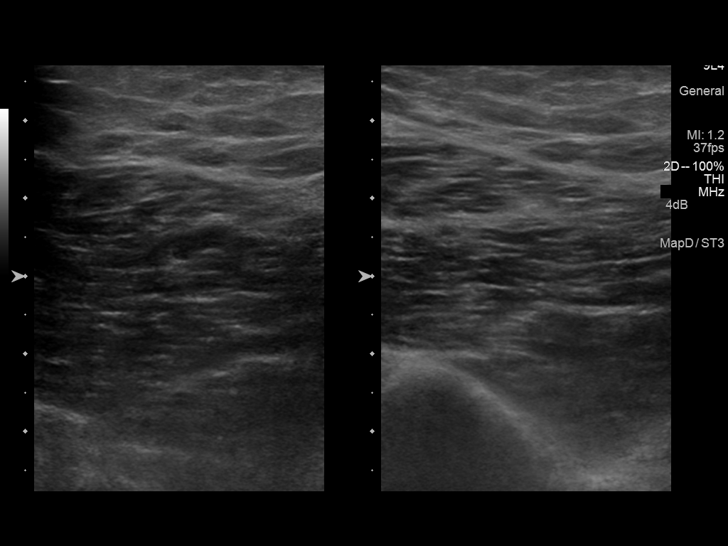
[im 28/50]
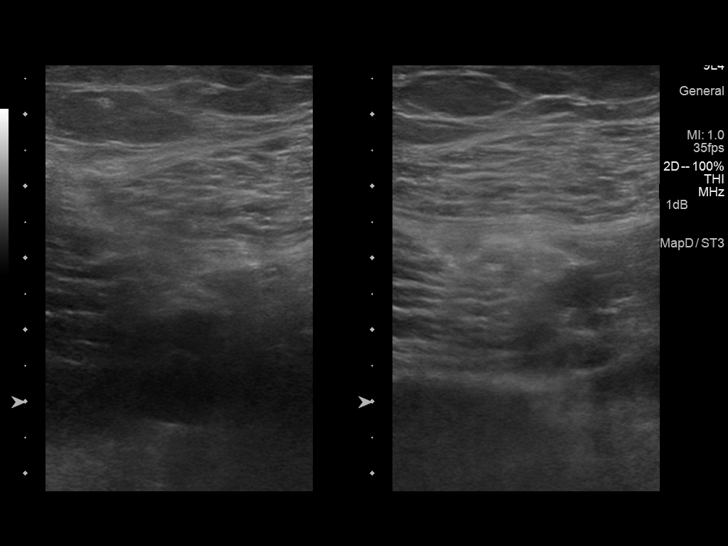
[im 32/50]
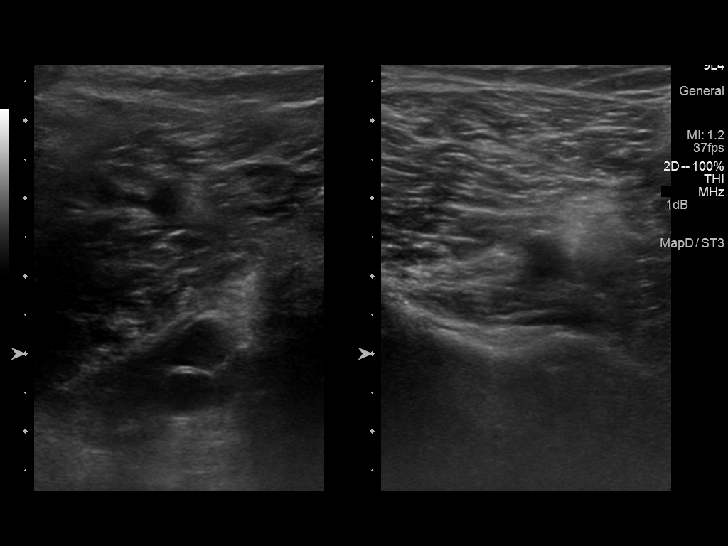
[im 37/50]
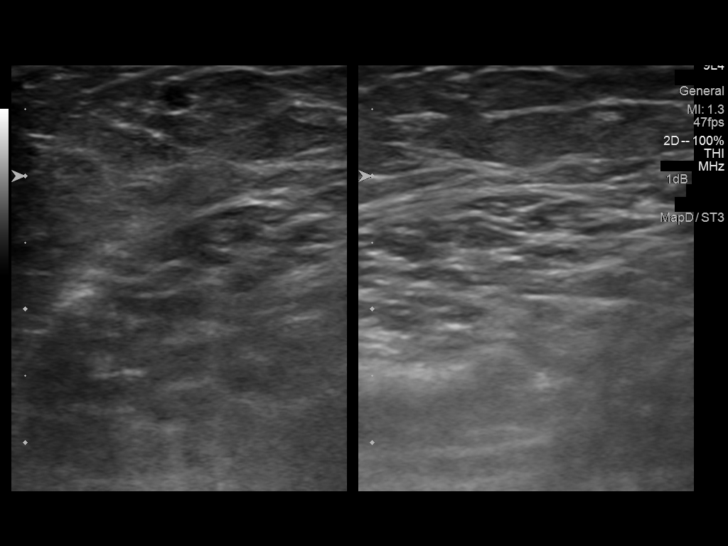
[im 41/50]
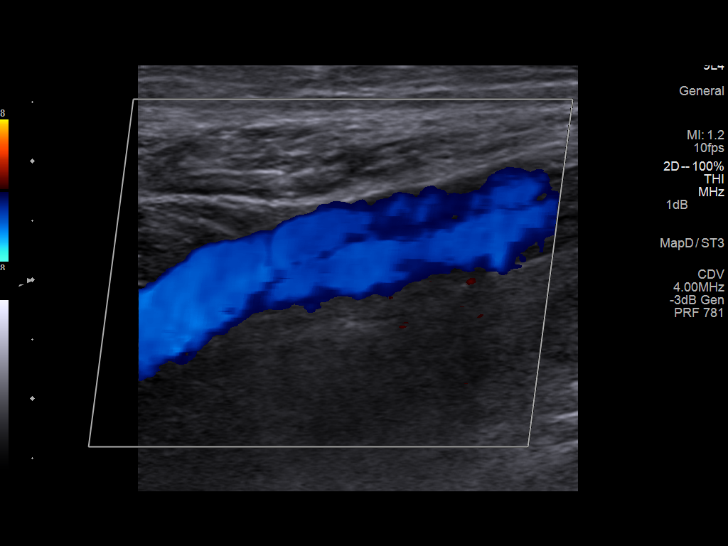
[im 45/50]
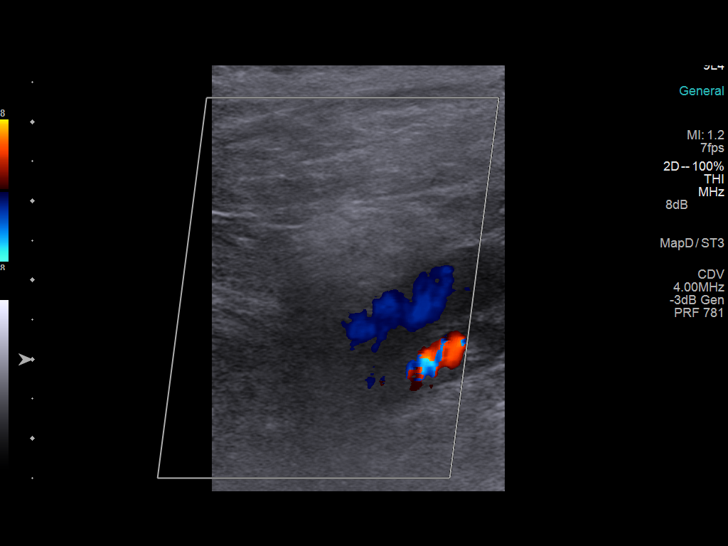
[im 50/50]
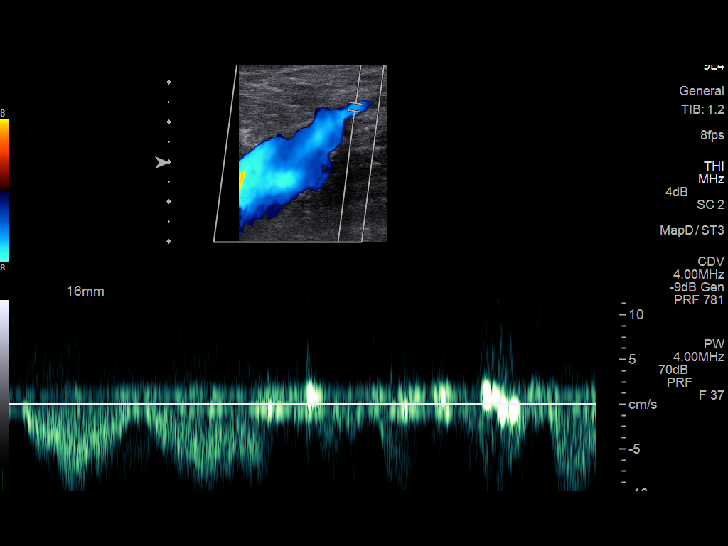

[13 of 24 positions shown; findings below may reference images not displayed]

FINDINGS: Contralateral Common Femoral Vein: Respiratory phasicity is normal
and symmetric with the symptomatic side. No evidence of thrombus.
Normal compressibility.

Common Femoral Vein: No evidence of thrombus. Normal
compressibility, respiratory phasicity and response to augmentation.

Saphenofemoral Junction: No evidence of thrombus. Normal
compressibility and flow on color Doppler imaging.

Profunda Femoral Vein: No evidence of thrombus. Normal
compressibility and flow on color Doppler imaging.

Femoral Vein: No evidence of thrombus. Normal compressibility,
respiratory phasicity and response to augmentation.

Popliteal Vein: No evidence of thrombus. Normal compressibility,
respiratory phasicity and response to augmentation.

Calf Veins: No evidence of thrombus. Normal compressibility and flow
on color Doppler imaging.

Superficial Great Saphenous Vein: No evidence of thrombus. Normal
compressibility and flow on color Doppler imaging.

Venous Reflux:  None.

Other Findings: There is bruising in the medial right thigh region
without well-defined mass
IMPRESSION: No evidence of right lower extremity deep venous thrombosis. Left
common femoral vein also uptake. Bruising right medial thigh without
well-defined mass.

## 2017-08-14 ENCOUNTER — Emergency Department: Payer: Medicaid Other

## 2017-08-14 ENCOUNTER — Emergency Department
Admission: EM | Admit: 2017-08-14 | Discharge: 2017-08-14 | Disposition: A | Discharge status: Home / Self Care | Payer: Medicaid Other | Attending: Emergency Medicine | Admitting: Emergency Medicine

## 2017-08-14 ENCOUNTER — Other Ambulatory Visit: Payer: Self-pay

## 2017-08-14 ENCOUNTER — Encounter: Payer: Self-pay | Admitting: Emergency Medicine

## 2017-08-14 DIAGNOSIS — J8410 Pulmonary fibrosis, unspecified: Secondary | ICD-10-CM

## 2017-08-14 DIAGNOSIS — F17210 Nicotine dependence, cigarettes, uncomplicated: Secondary | ICD-10-CM | POA: Diagnosis not present

## 2017-08-14 DIAGNOSIS — J438 Other emphysema: Secondary | ICD-10-CM | POA: Insufficient documentation

## 2017-08-14 DIAGNOSIS — R062 Wheezing: Secondary | ICD-10-CM | POA: Diagnosis not present

## 2017-08-14 DIAGNOSIS — R05 Cough: Secondary | ICD-10-CM | POA: Diagnosis not present

## 2017-08-14 DIAGNOSIS — I2510 Atherosclerotic heart disease of native coronary artery without angina pectoris: Secondary | ICD-10-CM | POA: Diagnosis not present

## 2017-08-14 DIAGNOSIS — R911 Solitary pulmonary nodule: Secondary | ICD-10-CM | POA: Diagnosis not present

## 2017-08-14 DIAGNOSIS — T7840XA Allergy, unspecified, initial encounter: Secondary | ICD-10-CM

## 2017-08-14 DIAGNOSIS — Z79899 Other long term (current) drug therapy: Secondary | ICD-10-CM | POA: Insufficient documentation

## 2017-08-14 LAB — BASIC METABOLIC PANEL
ANION GAP: 7 (ref 5–15)
BUN: 14 mg/dL (ref 6–20)
CALCIUM: 8.3 mg/dL — AB (ref 8.9–10.3)
CO2: 23 mmol/L (ref 22–32)
CREATININE: 1.03 mg/dL (ref 0.61–1.24)
Chloride: 107 mmol/L (ref 101–111)
GFR calc Af Amer: >60 mL/min (ref >60)
GFR calc non Af Amer: >60 mL/min (ref >60)
GLUCOSE: 100 mg/dL — AB (ref 65–99)
Potassium: 4.3 mmol/L (ref 3.5–5.1)
Sodium: 137 mmol/L (ref 135–145)

## 2017-08-14 MED ORDER — METHYLPREDNISOLONE 4 MG PO TBPK: ORAL_TABLET | ORAL | 0 refills | Status: DC

## 2017-08-14 MED ORDER — IOPAMIDOL (ISOVUE-300) INJECTION 61%
75.0000 mL | Freq: Once | INTRAVENOUS | Status: AC | PRN
  Administered 2017-08-14: 75 mL via INTRAVENOUS
  Filled 2017-08-14: qty 75

## 2017-08-14 NOTE — ED Triage Notes (Signed)
States [place] [employer], and while cooking took a big breath in and reacted to something.  States "I just didn't feel right".  States he felt like he couldn't breath.  Symptoms improved after stepping outside.    Patient is AAOx3.  Skin warm and dry.  Respirations regular and non labored.  Voice clear and strong.  NAD

## 2017-08-14 NOTE — Discharge Instructions (Signed)
Advised to [facility] to [facility] and further evaluation of CT findings.

## 2017-08-14 NOTE — ED Provider Notes (Signed)
[facility] Emergency Department Provider Note   ____________________________________________   First MD Initiated Contact with Patient 08/14/17 1000     (approximate)  I have reviewed the triage vital signs and the nursing notes.   HISTORY  Chief Complaint Allergic Reaction    HPI Alejandro Gibson is a 37 y.o. 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  Patient states this is the first day on the job in approximately 20 minutes after starting work he started coughing and wheezing.  Patient state he felt like he could not breathe and went outside in which his symptoms improved.  Patient has a history of asthma and does smoke tobacco products.  Patient is in no acute distress at this time.   History reviewed. No pertinent past medical history.  There are no active problems to display for this patient.   Past Surgical History:  Procedure Laterality Date  . HAND SURGERY     ORIF right ring finger  . KNEE SURGERY     arthroscopy right knee    Prior to Admission medications   Medication Sig Start Date End Date Taking? Authorizing Provider  ibuprofen (ADVIL,MOTRIN) 800 MG tablet Take 800 mg by mouth every 8 (eight) hours as needed (pain).    [provider]  meloxicam (MOBIC) 7.5 MG tablet Take 1 tablet (7.5 mg total) by mouth daily. 03/05/15   Street, Mercedes, PA-C  methylPREDNISolone (MEDROL DOSEPAK) 4 MG TBPK tablet Take Tapered dose as directed to start tomorrow morning. 08/14/17   Joni Reining, PA-C  traMADol (ULTRAM) 50 MG tablet Take 50 mg by mouth every 8 (eight) hours as needed (pain).    [provider]    Allergies Penicillins and Vicodin [hydrocodone-acetaminophen]  No family history on file.  Social History Social History   Tobacco Use  . Smoking status: Current Every Day Smoker    Packs/day: 1.00    Types: Cigarettes  . Smokeless  tobacco: Never Used  Substance Use Topics  . Alcohol use: Yes    Alcohol/week: 0.6 oz    Types: 1 Cans of beer per week  . Drug use: Yes    Types: Marijuana    Review of Systems Constitutional: No fever/chills Eyes: No visual changes. ENT: No sore throat. Cardiovascular: Denies chest pain. Respiratory: Shortness of breath, coughing, and wheezing. Gastrointestinal: No abdominal pain.  No nausea, no vomiting.  No diarrhea.  No constipation. Genitourinary: Negative for dysuria. Musculoskeletal: Negative for back pain. Skin: Negative for rash. Neurological: Negative for headaches, focal weakness or numbness. Allergic/Immunilogical: Codeine and penicillin. ____________________________________________   PHYSICAL EXAM:  VITAL SIGNS: ED Triage Vitals  Enc Vitals Group     BP 08/14/17 1001 (!) 122/104     Pulse Rate 08/14/17 1001 72     Resp 08/14/17 1001 18     Temp 08/14/17 1001 98.9 F (37.2 C)     Temp Source 08/14/17 1001 Oral     SpO2 08/14/17 1001 97 %     Weight 08/14/17 0942 232 lb (105.2 kg)     Height 08/14/17 0942 5\' 11"  (1.803 m)     Head Circumference --      Peak Flow --      Pain Score 08/14/17 0941 0     Pain Loc --      Pain Edu? --      Excl. in GC? --    Constitutional: Alert  and oriented. Well appearing and in no acute distress. Nose: No congestion/rhinnorhea. Mouth/Throat: Mucous membranes are moist.  Oropharynx non-erythematous. Neck: No stridor. Hematological/Lymphatic/Immunilogical: No cervical lymphadenopathy. Cardiovascular: Normal rate, regular rhythm. Grossly normal heart sounds.  Good peripheral circulation. Respiratory: Normal respiratory effort.  No retractions. Lungs CTAB. Gastrointestinal: Soft and nontender. No distention. No abdominal bruits. No CVA tenderness. Musculoskeletal: No lower extremity tenderness nor edema.  No joint effusions. Neurologic:  Normal speech and language. No gross focal neurologic deficits are appreciated. No gait  instability. Skin:  Skin is warm, dry and intact. No rash noted. Psychiatric: Mood and affect are normal. Speech and behavior are normal.  ____________________________________________   LABS (all labs ordered are listed, but only abnormal results are displayed)  Labs Reviewed  BASIC METABOLIC PANEL - Abnormal; Notable for the following components:      Result Value   Glucose, Bld 100 (*)    Calcium 8.3 (*)    All other components within normal limits   ____________________________________________  EKG   ____________________________________________  RADIOLOGY  Checks x-ray revealed nodular lesion left upper lung.  This was reevaluated with CT scan revealing granuloma, emphysema and coronary artery disease,  Official radiology report(s): Dg Chest 2 View  Result Date: 08/14/2017 CLINICAL DATA:  Smoker.  Possible large reaction. EXAM: CHEST - 2 VIEW COMPARISON:  June 20, 2012 FINDINGS: There is an apparent 4.7 mm nodule in the lateral left upper lobe. A prominent vessel on end is possible. The heart, hila, and mediastinum are normal. No pneumothorax. No nodules or masses. No focal infiltrates. No other acute abnormalities. IMPRESSION: Suspected 4.7 mm nodule in the left upper lobe. Given the patient's smoking history in the absence of this nodule on previous studies, recommend a CT scan. No other acute abnormalities. Electronically Signed   By: Gerome Sam III M.D   On: 08/14/2017 10:42   Ct Chest W Contrast  Result Date: 08/14/2017 CLINICAL DATA:  37 year old male with shortness of breath and possible LEFT UPPER lobe nodule identified on recent chest radiograph. EXAM: CT CHEST WITH CONTRAST TECHNIQUE: Multidetector CT imaging of the chest was performed during intravenous contrast administration. CONTRAST:  75mL ISOVUE-300 IOPAMIDOL (ISOVUE-300) INJECTION 61% COMPARISON:  08/14/2017 and prior chest radiographs FINDINGS: Cardiovascular: Heart size normal. Mild coronary artery  atherosclerotic calcifications noted. No thoracic aortic aneurysm or pericardial effusion. Mediastinum/Nodes: No enlarged mediastinal, hilar, or axillary lymph nodes. Thyroid gland, trachea, and esophagus demonstrate no significant findings. Lungs/Pleura: Mild centrilobular and paraseptal emphysema identified. No LEFT UPPER lung abnormality identified in the area of the recent chest radiograph finding. A 6 mm densely calcified nodule within the posterior LEFT UPPER lobe (3:26) is compatible with a benign nodule/calcified granuloma. No airspace disease, consolidation, mass, suspicious nodule, pleural effusion or pneumothorax noted. Upper Abdomen: No acute abnormality. Musculoskeletal: No chest wall abnormality. No acute or significant osseous findings. IMPRESSION: 1. No LEFT UPPER lung abnormality identified in the area of the recent chest radiograph finding. 2. 6 mm benign calcified nodule/granuloma within the LEFT UPPER lobe. 3. Coronary artery disease 4.  Emphysema (ICD10-J43.9). Electronically Signed   By: Harmon Pier M.D.   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  Patient state symptoms improved after going outside.  Patient presents to the ED with mild wheezing and cough.  Discussed chest x-ray and CT findings with patient.  Patient advised to avoid environmental area causing respiratory discomfort until further evaluation.  [facility]  Patient given discharge care instruction prescription for Medrol  Dosepak.      ____________________________________________   FINAL CLINICAL IMPRESSION(S) / ED DIAGNOSES  Final diagnoses:  Allergic reaction, initial encounter  Coronary artery disease involving native coronary artery of native heart without angina pectoris  Lung granuloma (HCC)  Other emphysema [facility](HCC)     ED Discharge Orders        Ordered    methylPREDNISolone (MEDROL DOSEPAK) 4 MG TBPK tablet     08/14/17 1301       Note:  This document was prepared using Dragon voice recognition software and may include unintentional dictation errors.    Joni ReiningSmith, Kalkidan Caudell K, PA-C 08/14/17 1315    Emily FilbertWilliams, Jonathan E, MD 08/14/17 [id number]

## 2017-09-03 ENCOUNTER — Encounter: Payer: Self-pay | Admitting: Emergency Medicine

## 2017-09-03 ENCOUNTER — Emergency Department
Admission: EM | Admit: 2017-09-03 | Discharge: 2017-09-03 | Disposition: A | Discharge status: Home / Self Care | Payer: Medicaid Other | Attending: Emergency Medicine | Admitting: Emergency Medicine

## 2017-09-03 ENCOUNTER — Emergency Department: Payer: Medicaid Other

## 2017-09-03 ENCOUNTER — Other Ambulatory Visit: Payer: Self-pay

## 2017-09-03 DIAGNOSIS — Z79899 Other long term (current) drug therapy: Secondary | ICD-10-CM | POA: Diagnosis not present

## 2017-09-03 DIAGNOSIS — S39012A Strain of muscle, fascia and tendon of lower back, initial encounter: Secondary | ICD-10-CM | POA: Diagnosis not present

## 2017-09-03 DIAGNOSIS — F17210 Nicotine dependence, cigarettes, uncomplicated: Secondary | ICD-10-CM | POA: Diagnosis not present

## 2017-09-03 DIAGNOSIS — Y929 Unspecified place or not applicable: Secondary | ICD-10-CM | POA: Insufficient documentation

## 2017-09-03 DIAGNOSIS — Y990 Civilian activity done for income or pay: Secondary | ICD-10-CM | POA: Insufficient documentation

## 2017-09-03 DIAGNOSIS — Y939 Activity, unspecified: Secondary | ICD-10-CM | POA: Insufficient documentation

## 2017-09-03 DIAGNOSIS — W2209XA Striking against other stationary object, initial encounter: Secondary | ICD-10-CM | POA: Insufficient documentation

## 2017-09-03 DIAGNOSIS — S3992XA Unspecified injury of lower back, initial encounter: Secondary | ICD-10-CM | POA: Diagnosis present

## 2017-09-03 LAB — URINALYSIS, COMPLETE (UACMP) WITH MICROSCOPIC
BILIRUBIN URINE: NEGATIVE
Glucose, UA: NEGATIVE mg/dL
Hgb urine dipstick: NEGATIVE
KETONES UR: NEGATIVE mg/dL
LEUKOCYTES UA: NEGATIVE
Nitrite: NEGATIVE
PH: 8 (ref 5.0–8.0)
PROTEIN: NEGATIVE mg/dL
Specific Gravity, Urine: 1.004 — ABNORMAL LOW (ref 1.005–1.030)

## 2017-09-03 MED ORDER — PREDNISONE 10 MG PO TABS: ORAL_TABLET | ORAL | 0 refills | Status: DC

## 2017-09-03 MED ORDER — TRAMADOL HCL 50 MG PO TABS: 50.0000 mg | ORAL_TABLET | Freq: Four times a day (QID) | ORAL | 0 refills | Status: DC | PRN

## 2017-09-03 MED ORDER — METHOCARBAMOL 500 MG PO TABS: ORAL_TABLET | ORAL | 0 refills | Status: AC

## 2017-09-03 NOTE — Discharge Instructions (Addendum)
Follow-up with [facility] acute care or Dr. Allena KatzPatel who is on-call for orthopedics.  Because you have Medicaid [place] she may need a referral to the orthopedist.  Begin taking prednisone as directed beginning with 6 tablets and tapering down each day.  Methocarbamol 1 or 2 tablets every 6 hours as needed for muscle spasms and tramadol if needed for pain.

## 2017-09-03 NOTE — ED Triage Notes (Signed)
Pt states that he injured the left side of his back last Friday while he was working, pt states that he turned a corner fast and hit his back on a vinyl shelf, pt states that it is cont to hurt him,pt reports pain with sitting straight

## 2017-09-03 NOTE — ED Notes (Signed)
Patient reports back pain for several days after hitting his back on something jutting out from the wall at work.  Patient states he rested x 2 days but then when he went back to work yesterday, pain was severe.  Patient states he is unsure whether or not he wants to file workman's comp. States he did notify his employer of his injury when it occurred.

## 2017-09-03 NOTE — ED Notes (Signed)
Pt ambulatory upon discharge. Verbalized understanding of discharge instructions, follow-up care and prescriptions. VSS. Skin warm and dry. A&O x4.  

## 2017-09-03 NOTE — ED Provider Notes (Signed)
[facility] Emergency Department Provider Note   ____________________________________________   First MD Initiated Contact with Patient 09/03/17 [phone]     (approximate)  I have reviewed the triage vital signs and the nursing notes.   HISTORY  Chief Complaint Back Pain   HPI Alejandro Gibson is a 37 y.o. male is here with complaint of left sided back pain for 1 week.  Patient states that he was working and turned the corner fast hitting his back on a vinyl shelf.  Patient states he has taken over-the-counter medication without any relief.  He continues to have pain in that area which occasionally shoots across his back to the right side.  He denies any hematuria or difficulty walking.  Patient has had difficulty with sitting and states he cannot get in certain positions due to pain.  He denies any previous back problems.  He rates his pain as an 8 out of 10.  History reviewed. No pertinent past medical history.  There are no active problems to display for this patient.   Past Surgical History:  Procedure Laterality Date  . HAND SURGERY     ORIF right ring finger  . KNEE SURGERY     arthroscopy right knee    Prior to Admission medications   Medication Sig Start Date End Date Taking? Authorizing Provider  methocarbamol (ROBAXIN) 500 MG tablet 1-2 tablets qid prn muscle spasms. 09/03/17   Tommi Rumps, PA-C  predniSONE (DELTASONE) 10 MG tablet Take 6 tablets  today, on day 2 take 5 tablets, day 3 take 4 tablets, day 4 take 3 tablets, day 5 take  2 tablets and 1 tablet the last day 09/03/17   Tommi Rumps, PA-C  traMADol (ULTRAM) 50 MG tablet Take 1 tablet (50 mg total) by mouth every 6 (six) hours as needed. 09/03/17   Tommi Rumps, PA-C    Allergies Penicillins and Vicodin [hydrocodone-acetaminophen]  No family history on file.  Social History Social History   Tobacco Use  . Smoking status: Current Every Day Smoker    Packs/day:  1.00    Types: Cigarettes  . Smokeless tobacco: Never Used  Substance Use Topics  . Alcohol use: Yes    Alcohol/week: 0.6 oz    Types: 1 Cans of beer per week  . Drug use: Yes    Types: Marijuana    Review of Systems Constitutional: No fever/chills Cardiovascular: Denies chest pain. Respiratory: Denies shortness of breath. Gastrointestinal: No abdominal pain.  No nausea, no vomiting.  Genitourinary: Negative for dysuria.  Negative for hematuria. Musculoskeletal: Positive for back pain. Skin: Negative for rash. Neurological: Negative for  focal weakness or numbness. ____________________________________________   PHYSICAL EXAM:  VITAL SIGNS: ED Triage Vitals  Enc Vitals Group     BP 09/03/17 0901 (!) 133/92     Pulse Rate 09/03/17 0901 75     Resp 09/03/17 0901 18     Temp 09/03/17 0901 97.6 F (36.4 C)     Temp Source 09/03/17 0901 Oral     SpO2 09/03/17 0901 98 %     Weight 09/03/17 0910 232 lb (105.2 kg)     Height 09/03/17 0910 5\' 11"  (1.803 m)     Head Circumference --      Peak Flow --      Pain Score 09/03/17 0909 8     Pain Loc --      Pain Edu? --      Excl. in GC? --  Constitutional: Alert and oriented. Well appearing and in no acute distress. Eyes: Conjunctivae are normal.  Head: Atraumatic. Neck: No stridor.   Cardiovascular: Normal rate, regular rhythm. Grossly normal heart sounds.  Good peripheral circulation. Respiratory: Normal respiratory effort.  No retractions. Lungs CTAB. Gastrointestinal: Soft and nontender. No distention.  No CVA tenderness. Musculoskeletal: On examination of the back there is no gross deformity and no skin discoloration or abrasions seen.  Range of motion is restricted secondary to discomfort.  Moderate tenderness on palpation diffusely over the lower lumbar spine and sacrum bilaterally.  Straight leg raises were negative.  Good muscle strength bilaterally.  Normal gait was noted. Neurologic:  Normal speech and language. No  gross focal neurologic deficits are appreciated.  Reflexes were 2+ bilaterally.  No gait instability. Skin:  Skin is warm, dry and intact.  No ecchymosis, abrasions, or erythema present. Psychiatric: Mood and affect are normal. Speech and behavior are normal.  ____________________________________________   LABS (all labs ordered are listed, but only abnormal results are displayed)  Labs Reviewed  URINALYSIS, COMPLETE (UACMP) WITH MICROSCOPIC - Abnormal; Notable for the following components:      Result Value   Color, Urine YELLOW (*)    APPearance CLEAR (*)    Specific Gravity, Urine 1.004 (*)    All other components within normal limits    RADIOLOGY  ED MD interpretation:  Lumbar spine x-ray is negative for fracture.  Official radiology report(s): Dg Lumbar Spine 2-3 Views  Result Date: 09/03/2017 CLINICAL DATA:  Back injury. EXAM: LUMBAR SPINE - 2-3 VIEW COMPARISON:  CT 08/14/2017. FINDINGS: Paraspinal soft tissues are normal. Diffuse mild degenerative change. No acute bony abnormality. No evidence of fracture. IMPRESSION: Negative. Electronically Signed   By: Maisie Fus  Register   On: 09/03/2017 11:09  ____________________________________________   PROCEDURES  Procedure(s) performed: None  Procedures  Critical Care performed: No  ____________________________________________   INITIAL IMPRESSION / ASSESSMENT AND PLAN / ED COURSE  As part of my medical decision making, I reviewed the following data within the electronic MEDICAL RECORD NUMBER Notes from prior ED visits and [place] Substance Database  Patient drove to the ED and had taken NSAIDs prior to his arrival.  Patient was reassured that x-ray did not show any bony abnormality.  Patient was started on prednisone 60 mg 6-day taper, Robaxin 500 mg 1 or 2 every 6 hours as needed for muscle spasms and tramadol 50 mg 1 every 6 hours if needed for moderate pain.  Patient is encouraged to use ice or heat to his back as  needed for discomfort.  He is to follow-up with [facility] acute care or Dr. Allena Katz who is on-call for orthopedics if any continued problems.  He was made aware because he is medicaid [place] [employer] he may need a referral from his PCP.  ____________________________________________   FINAL CLINICAL IMPRESSION(S) / ED DIAGNOSES  Final diagnoses:  Strain of lumbar region, initial encounter     ED Discharge Orders        Ordered    predniSONE (DELTASONE) 10 MG tablet     09/03/17 1134    methocarbamol (ROBAXIN) 500 MG tablet     09/03/17 1134    traMADol (ULTRAM) 50 MG tablet  Every 6 hours PRN     09/03/17 1134       Note:  This document was prepared using Dragon voice recognition software and may include unintentional dictation errors.    Tommi Rumps, PA-C 09/03/17 1314  Darci CurrentBrown, [place] N, MD 09/03/17 [phone]

## 2017-09-08 ENCOUNTER — Ambulatory Visit: Payer: Self-pay | Admitting: Physician Assistant

## 2017-09-16 ENCOUNTER — Institutional Professional Consult (permissible substitution): Payer: Medicaid Other | Admitting: Internal Medicine

## 2017-10-07 ENCOUNTER — Ambulatory Visit: Payer: Self-pay | Admitting: Allergy

## 2017-10-18 ENCOUNTER — Institutional Professional Consult (permissible substitution): Payer: Medicaid Other | Admitting: Internal Medicine

## 2020-01-13 ENCOUNTER — Emergency Department
Admission: EM | Admit: 2020-01-13 | Discharge: 2020-01-13 | Disposition: A | Discharge status: Home / Self Care | Payer: Medicaid Other | Attending: Emergency Medicine | Admitting: Emergency Medicine

## 2020-01-13 DIAGNOSIS — Z5321 Procedure and treatment not carried out due to patient leaving prior to being seen by health care provider: Secondary | ICD-10-CM | POA: Insufficient documentation

## 2020-01-13 DIAGNOSIS — R55 Syncope and collapse: Secondary | ICD-10-CM | POA: Insufficient documentation

## 2020-01-13 NOTE — ED Notes (Signed)
Patient called for triage with no answer

## 2020-01-13 NOTE — ED Triage Notes (Signed)
Patient brought in by ems from home. Patient ate mushrooms and drank alcohol. Per ems patient passed out and is now feeling jittery.

## 2020-08-02 ENCOUNTER — Encounter
Admission: EM | Disposition: A | Discharge status: Home / Self Care | Payer: Self-pay | Source: Home / Self Care | Attending: Emergency Medicine

## 2020-08-02 ENCOUNTER — Emergency Department: Payer: Medicaid Other

## 2020-08-02 ENCOUNTER — Observation Stay
Admission: EM | Admit: 2020-08-02 | Discharge: 2020-08-03 | Disposition: A | Discharge status: Home / Self Care | Payer: Medicaid Other | Attending: Internal Medicine | Admitting: Internal Medicine

## 2020-08-02 ENCOUNTER — Other Ambulatory Visit: Payer: Self-pay

## 2020-08-02 ENCOUNTER — Encounter: Payer: Self-pay | Admitting: Emergency Medicine

## 2020-08-02 DIAGNOSIS — I160 Hypertensive urgency: Secondary | ICD-10-CM | POA: Diagnosis not present

## 2020-08-02 DIAGNOSIS — I25110 Atherosclerotic heart disease of native coronary artery with unstable angina pectoris: Principal | ICD-10-CM | POA: Insufficient documentation

## 2020-08-02 DIAGNOSIS — J449 Chronic obstructive pulmonary disease, unspecified: Secondary | ICD-10-CM | POA: Diagnosis not present

## 2020-08-02 DIAGNOSIS — I10 Essential (primary) hypertension: Secondary | ICD-10-CM | POA: Insufficient documentation

## 2020-08-02 DIAGNOSIS — F17210 Nicotine dependence, cigarettes, uncomplicated: Secondary | ICD-10-CM | POA: Insufficient documentation

## 2020-08-02 DIAGNOSIS — R079 Chest pain, unspecified: Secondary | ICD-10-CM | POA: Diagnosis present

## 2020-08-02 DIAGNOSIS — I214 Non-ST elevation (NSTEMI) myocardial infarction: Secondary | ICD-10-CM | POA: Insufficient documentation

## 2020-08-02 DIAGNOSIS — E785 Hyperlipidemia, unspecified: Secondary | ICD-10-CM

## 2020-08-02 DIAGNOSIS — Z720 Tobacco use: Secondary | ICD-10-CM | POA: Diagnosis not present

## 2020-08-02 DIAGNOSIS — Z20822 Contact with and (suspected) exposure to covid-19: Secondary | ICD-10-CM | POA: Insufficient documentation

## 2020-08-02 DIAGNOSIS — I2510 Atherosclerotic heart disease of native coronary artery without angina pectoris: Secondary | ICD-10-CM | POA: Diagnosis not present

## 2020-08-02 DIAGNOSIS — I200 Unstable angina: Secondary | ICD-10-CM | POA: Diagnosis present

## 2020-08-02 HISTORY — DX: Chronic obstructive pulmonary disease, unspecified: J44.9

## 2020-08-02 HISTORY — PX: LEFT HEART CATH AND CORONARY ANGIOGRAPHY: CATH118249

## 2020-08-02 HISTORY — DX: Essential (primary) hypertension: I10

## 2020-08-02 LAB — CBC
HCT: 48.4 % (ref 39.0–52.0)
HCT: 46.6 % (ref 39.0–52.0)
Hemoglobin: 16.1 g/dL (ref 13.0–17.0)
Hemoglobin: 15.1 g/dL (ref 13.0–17.0)
MCH: 29.5 pg (ref 26.0–34.0)
MCH: 28.7 pg (ref 26.0–34.0)
MCHC: 33.3 g/dL (ref 30.0–36.0)
MCHC: 32.4 g/dL (ref 30.0–36.0)
MCV: 88.8 fL (ref 80.0–100.0)
MCV: 88.6 fL (ref 80.0–100.0)
Platelets: 272 10*3/uL (ref 150–400)
Platelets: 269 10*3/uL (ref 150–400)
RBC: 5.45 MIL/uL (ref 4.22–5.81)
RBC: 5.26 MIL/uL (ref 4.22–5.81)
RDW: 13.2 % (ref 11.5–15.5)
RDW: 13 % (ref 11.5–15.5)
WBC: 8.2 10*3/uL (ref 4.0–10.5)
WBC: 7.2 10*3/uL (ref 4.0–10.5)
nRBC: 0 % (ref 0.0–0.2)
nRBC: 0 % (ref 0.0–0.2)

## 2020-08-02 LAB — RESP PANEL BY RT-PCR (FLU A&B, COVID) ARPGX2
Influenza A by PCR: NEGATIVE
Influenza B by PCR: NEGATIVE
SARS Coronavirus 2 by RT PCR: NEGATIVE

## 2020-08-02 LAB — TROPONIN I (HIGH SENSITIVITY)
Troponin I (High Sensitivity): 371 ng/L (ref <18)
Troponin I (High Sensitivity): 2498 ng/L (ref <18)

## 2020-08-02 LAB — BASIC METABOLIC PANEL
Anion gap: 8 (ref 5–15)
BUN: 13 mg/dL (ref 6–20)
CO2: 24 mmol/L (ref 22–32)
Calcium: 8.9 mg/dL (ref 8.9–10.3)
Chloride: 109 mmol/L (ref 98–111)
Creatinine, Ser: 0.91 mg/dL (ref 0.61–1.24)
GFR, Estimated: >60 mL/min (ref >60)
Glucose, Bld: 94 mg/dL (ref 70–99)
Potassium: 4.3 mmol/L (ref 3.5–5.1)
Sodium: 141 mmol/L (ref 135–145)

## 2020-08-02 LAB — APTT: aPTT: 28 seconds (ref 24–36)

## 2020-08-02 LAB — HEPARIN LEVEL (UNFRACTIONATED): Heparin Unfractionated: <0.1 IU/mL — ABNORMAL LOW (ref 0.30–0.70)

## 2020-08-02 LAB — PROTIME-INR
INR: 1 (ref 0.8–1.2)
Prothrombin Time: 12.3 seconds (ref 11.4–15.2)

## 2020-08-02 LAB — PHOSPHORUS: Phosphorus: 3.3 mg/dL (ref 2.5–4.6)

## 2020-08-02 LAB — MAGNESIUM: Magnesium: 2.2 mg/dL (ref 1.7–2.4)

## 2020-08-02 SURGERY — LEFT HEART CATH AND CORONARY ANGIOGRAPHY
Anesthesia: Moderate Sedation

## 2020-08-02 MED ORDER — ADULT MULTIVITAMIN W/MINERALS CH
1.0000 | ORAL_TABLET | Freq: Every day | ORAL | Status: DC
  Administered 2020-08-03: 1 via ORAL
  Filled 2020-08-02: qty 1

## 2020-08-02 MED ORDER — SODIUM CHLORIDE 0.9 % WEIGHT BASED INFUSION: 3.0000 mL/kg/h | INTRAVENOUS | Status: DC

## 2020-08-02 MED ORDER — MIDAZOLAM HCL 2 MG/2ML IJ SOLN
INTRAMUSCULAR | Status: DC | PRN
  Administered 2020-08-02 (×2): 1 mg via INTRAVENOUS

## 2020-08-02 MED ORDER — LORAZEPAM 0.5 MG PO TABS
0.5000 mg | ORAL_TABLET | Freq: Once | ORAL | Status: AC
  Administered 2020-08-02: 0.5 mg via ORAL
  Filled 2020-08-02: qty 1

## 2020-08-02 MED ORDER — ATORVASTATIN CALCIUM 80 MG PO TABS
80.0000 mg | ORAL_TABLET | Freq: Every day | ORAL | Status: DC
  Administered 2020-08-02: 80 mg via ORAL
  Filled 2020-08-02 (×3): qty 1

## 2020-08-02 MED ORDER — HEPARIN (PORCINE) IN NACL 1000-0.9 UT/500ML-% IV SOLN
INTRAVENOUS | Status: DC | PRN
  Administered 2020-08-02: 500 mL

## 2020-08-02 MED ORDER — VERAPAMIL HCL 2.5 MG/ML IV SOLN
INTRAVENOUS | Status: DC | PRN
  Administered 2020-08-02: 2.5 mg via INTRAVENOUS

## 2020-08-02 MED ORDER — LABETALOL HCL 5 MG/ML IV SOLN
10.0000 mg | Freq: Once | INTRAVENOUS | Status: AC
  Administered 2020-08-02: 10 mg via INTRAVENOUS
  Filled 2020-08-02: qty 4

## 2020-08-02 MED ORDER — HEPARIN BOLUS VIA INFUSION
4000.0000 [IU] | Freq: Once | INTRAVENOUS | Status: AC
  Administered 2020-08-02: 4000 [IU] via INTRAVENOUS
  Filled 2020-08-02: qty 4000

## 2020-08-02 MED ORDER — MIDAZOLAM HCL 2 MG/2ML IJ SOLN
INTRAMUSCULAR | Status: AC
  Filled 2020-08-02: qty 2

## 2020-08-02 MED ORDER — HEPARIN (PORCINE) 25000 UT/250ML-% IV SOLN
1400.0000 [IU]/h | INTRAVENOUS | Status: DC
  Administered 2020-08-02: 1400 [IU]/h via INTRAVENOUS
  Filled 2020-08-02: qty 250

## 2020-08-02 MED ORDER — THIAMINE HCL 100 MG/ML IJ SOLN: 100.0000 mg | Freq: Every day | INTRAMUSCULAR | Status: DC

## 2020-08-02 MED ORDER — ASPIRIN 81 MG PO CHEW: 81.0000 mg | CHEWABLE_TABLET | ORAL | Status: DC

## 2020-08-02 MED ORDER — ASPIRIN EC 81 MG PO TBEC
81.0000 mg | DELAYED_RELEASE_TABLET | Freq: Every day | ORAL | Status: DC
  Administered 2020-08-03: 81 mg via ORAL
  Filled 2020-08-02: qty 1

## 2020-08-02 MED ORDER — SODIUM CHLORIDE 0.9% FLUSH: 3.0000 mL | Freq: Two times a day (BID) | INTRAVENOUS | Status: DC

## 2020-08-02 MED ORDER — LORAZEPAM 1 MG PO TABS: 1.0000 mg | ORAL_TABLET | ORAL | Status: DC | PRN

## 2020-08-02 MED ORDER — HEPARIN SODIUM (PORCINE) 1000 UNIT/ML IJ SOLN
INTRAMUSCULAR | Status: DC | PRN
  Administered 2020-08-02: 2000 [IU] via INTRAVENOUS

## 2020-08-02 MED ORDER — SODIUM CHLORIDE 0.9% FLUSH: 3.0000 mL | INTRAVENOUS | Status: DC | PRN

## 2020-08-02 MED ORDER — FENTANYL CITRATE (PF) 100 MCG/2ML IJ SOLN
INTRAMUSCULAR | Status: DC | PRN
  Administered 2020-08-02: 50 ug via INTRAVENOUS

## 2020-08-02 MED ORDER — SODIUM CHLORIDE 0.9 % WEIGHT BASED INFUSION: 1.0000 mL/kg/h | INTRAVENOUS | Status: DC

## 2020-08-02 MED ORDER — LABETALOL HCL 5 MG/ML IV SOLN
10.0000 mg | INTRAVENOUS | Status: DC | PRN
  Administered 2020-08-02 – 2020-08-03 (×4): 10 mg via INTRAVENOUS
  Filled 2020-08-02 (×3): qty 4

## 2020-08-02 MED ORDER — FOLIC ACID 1 MG PO TABS
1.0000 mg | ORAL_TABLET | Freq: Every day | ORAL | Status: DC
  Administered 2020-08-03: 1 mg via ORAL
  Filled 2020-08-02: qty 1

## 2020-08-02 MED ORDER — SODIUM CHLORIDE 0.9 % IV SOLN
250.0000 mL | INTRAVENOUS | Status: DC | PRN
Start: 2020-08-02 — End: 2020-08-03

## 2020-08-02 MED ORDER — VERAPAMIL HCL 2.5 MG/ML IV SOLN
INTRAVENOUS | Status: AC
  Filled 2020-08-02: qty 2

## 2020-08-02 MED ORDER — FENTANYL CITRATE (PF) 100 MCG/2ML IJ SOLN
INTRAMUSCULAR | Status: AC
  Filled 2020-08-02: qty 2

## 2020-08-02 MED ORDER — CARVEDILOL 12.5 MG PO TABS
12.5000 mg | ORAL_TABLET | Freq: Two times a day (BID) | ORAL | Status: DC
  Administered 2020-08-02 – 2020-08-03 (×2): 12.5 mg via ORAL
  Filled 2020-08-02 (×5): qty 1

## 2020-08-02 MED ORDER — ONDANSETRON HCL 4 MG/2ML IJ SOLN: 4.0000 mg | Freq: Four times a day (QID) | INTRAMUSCULAR | Status: DC | PRN

## 2020-08-02 MED ORDER — LABETALOL HCL 5 MG/ML IV SOLN
INTRAVENOUS | Status: AC
  Administered 2020-08-02: 10 mg via INTRAVENOUS
  Filled 2020-08-02: qty 4

## 2020-08-02 MED ORDER — ASPIRIN 81 MG PO CHEW
81.0000 mg | CHEWABLE_TABLET | Freq: Once | ORAL | Status: AC
  Administered 2020-08-02: 81 mg via ORAL

## 2020-08-02 MED ORDER — THIAMINE HCL 100 MG PO TABS
100.0000 mg | ORAL_TABLET | Freq: Every day | ORAL | Status: DC
  Administered 2020-08-03: 100 mg via ORAL
  Filled 2020-08-02: qty 1

## 2020-08-02 MED ORDER — HEPARIN (PORCINE) IN NACL 1000-0.9 UT/500ML-% IV SOLN
INTRAVENOUS | Status: AC
  Filled 2020-08-02: qty 1000

## 2020-08-02 MED ORDER — SODIUM CHLORIDE 0.9 % IV SOLN: INTRAVENOUS | Status: AC

## 2020-08-02 MED ORDER — NITROGLYCERIN 0.4 MG SL SUBL
0.4000 mg | SUBLINGUAL_TABLET | SUBLINGUAL | Status: DC | PRN
  Administered 2020-08-02: 0.4 mg via SUBLINGUAL
  Filled 2020-08-02: qty 1

## 2020-08-02 MED ORDER — NICOTINE 14 MG/24HR TD PT24
14.0000 mg | MEDICATED_PATCH | Freq: Every day | TRANSDERMAL | Status: DC
  Administered 2020-08-02 – 2020-08-03 (×2): 14 mg via TRANSDERMAL
  Filled 2020-08-02 (×2): qty 1

## 2020-08-02 MED ORDER — AMLODIPINE BESYLATE 5 MG PO TABS
5.0000 mg | ORAL_TABLET | Freq: Every day | ORAL | Status: DC
  Administered 2020-08-02 – 2020-08-03 (×2): 5 mg via ORAL
  Filled 2020-08-02 (×2): qty 1

## 2020-08-02 MED ORDER — LORAZEPAM 2 MG/ML IJ SOLN: 1.0000 mg | INTRAMUSCULAR | Status: DC | PRN

## 2020-08-02 MED ORDER — HEPARIN SODIUM (PORCINE) 1000 UNIT/ML IJ SOLN
INTRAMUSCULAR | Status: AC
  Filled 2020-08-02: qty 1

## 2020-08-02 MED ORDER — IOHEXOL 300 MG/ML  SOLN
INTRAMUSCULAR | Status: DC | PRN
  Administered 2020-08-02: 65 mL

## 2020-08-02 MED ORDER — SODIUM CHLORIDE 0.9% FLUSH
3.0000 mL | Freq: Two times a day (BID) | INTRAVENOUS | Status: DC
  Administered 2020-08-03: 3 mL via INTRAVENOUS

## 2020-08-02 MED ORDER — ENOXAPARIN SODIUM 40 MG/0.4ML ~~LOC~~ SOLN
40.0000 mg | SUBCUTANEOUS | Status: DC
  Filled 2020-08-02 (×2): qty 0.4

## 2020-08-02 MED ORDER — SODIUM CHLORIDE 0.9 % IV SOLN: 250.0000 mL | INTRAVENOUS | Status: DC | PRN

## 2020-08-02 SURGICAL SUPPLY — 9 items
CATH INFINITI 5FR JK (CATHETERS) ×1 IMPLANT
DEVICE RAD TR BAND REGULAR (VASCULAR PRODUCTS) ×1 IMPLANT
DRAPE BRACHIAL (DRAPES) ×1 IMPLANT
GLIDESHEATH SLEND SS 6F .021 (SHEATH) ×1 IMPLANT
GUIDEWIRE INQWIRE 1.5J.035X260 (WIRE) IMPLANT
INQWIRE 1.5J .035X260CM (WIRE) ×2
KIT ENCORE 26 ADVANTAGE (KITS) IMPLANT
KIT MANI 3VAL PERCEP (MISCELLANEOUS) ×1 IMPLANT
PACK CARDIAC CATH (CUSTOM PROCEDURE TRAY) ×2 IMPLANT

## 2020-08-02 NOTE — ED Provider Notes (Signed)
[facility] Emergency Department Provider Note   ____________________________________________   Event Date/Time   First MD Initiated Contact with Patient 08/02/20 1441     (approximate)  I have reviewed the triage vital signs and the nursing notes.   HISTORY  Chief Complaint Chest Pain    HPI Alejandro Gibson is a 40 y.o. male with past medical history of hypertension and COPD who presents to the ED complaining of chest pain.  Patient reports that he has been dealing with intermittent pain in his left arm for the past 4 to 5 days, which he attributed to overuse while working as a [employer].  He became more concerned today when he developed burning pain in the center of his chest along with increased severity of pain in his left arm.  He states "I felt like I was having a heart attack earlier" but now feels like the pain is improved.  He denies any associated fevers, cough, shortness of breath, nausea, or vomiting.  He has never had similar pain in the past, does report a family history of heart disease and is a 1 pack/day smoker.  He denies cocaine use.  He states he took three 81 mg aspirin at home just prior to arrival.        Past Medical History:  Diagnosis Date  . COPD (chronic obstructive pulmonary disease) (HCC)   . Essential hypertension     Patient Active Problem List   Diagnosis Date Noted  . NSTEMI (non-ST elevated myocardial infarction) (HCC) 08/02/2020  . Hypertensive urgency 08/02/2020  . Tobacco use 08/02/2020  . Unstable angina (HCC) 08/02/2020    Past Surgical History:  Procedure Laterality Date  . HAND SURGERY     ORIF right ring finger  . KNEE SURGERY     arthroscopy right knee    Prior to Admission medications   Medication Sig Start Date End Date Taking? Authorizing Provider  methocarbamol (ROBAXIN) 500 MG tablet 1-2 tablets qid prn muscle spasms. 09/03/17   Tommi Rumps, PA-C  predniSONE (DELTASONE) 10 MG  tablet Take 6 tablets  today, on day 2 take 5 tablets, day 3 take 4 tablets, day 4 take 3 tablets, day 5 take  2 tablets and 1 tablet the last day 09/03/17   Tommi Rumps, PA-C  traMADol (ULTRAM) 50 MG tablet Take 1 tablet (50 mg total) by mouth every 6 (six) hours as needed. 09/03/17   Tommi Rumps, PA-C    Allergies Penicillins and Vicodin [hydrocodone-acetaminophen]  Family History  Problem Relation Age of Onset  . Colon cancer Mother   . CAD Father        a. 28s to 35s  . CAD Paternal Grandfather     Social History Social History   Tobacco Use  . Smoking status: Current Every Day Smoker    Packs/day: 1.00    Types: Cigarettes  . Smokeless tobacco: Never Used  Substance Use Topics  . Alcohol use: Yes    Alcohol/week: 1.0 standard drink    Types: 1 Cans of beer per week  . Drug use: Yes    Types: Marijuana    Review of Systems  Constitutional: No fever/chills Eyes: No visual changes. ENT: No sore throat. Cardiovascular: Positive for chest pain. Respiratory: Denies shortness of breath. Gastrointestinal: No abdominal pain.  No nausea, no vomiting.  No diarrhea.  No constipation. Genitourinary: Negative for dysuria. Musculoskeletal: Negative for back pain. Skin: Negative for rash. Neurological: Negative for  headaches, focal weakness or numbness.  ____________________________________________   PHYSICAL EXAM:  VITAL SIGNS: ED Triage Vitals  Enc Vitals Group     BP 08/02/20 1320 (!) 194/110     Pulse Rate 08/02/20 1320 70     Resp 08/02/20 1320 16     Temp 08/02/20 1320 97.9 F (36.6 C)     Temp Source 08/02/20 1320 Oral     SpO2 08/02/20 1320 98 %     Weight 08/02/20 1317 260 lb (117.9 kg)     Height 08/02/20 1317 5\' 11"  (1.803 m)     Head Circumference --      Peak Flow --      Pain Score 08/02/20 1317 7     Pain Loc --      Pain Edu? --      Excl. in GC? --     Constitutional: Alert and oriented. Eyes: Conjunctivae are normal. Head:  Atraumatic. Nose: No congestion/rhinnorhea. Mouth/Throat: Mucous membranes are moist. Neck: Normal ROM Cardiovascular: Normal rate, regular rhythm. Grossly normal heart sounds.  2+ radial pulses bilaterally. Respiratory: Normal respiratory effort.  No retractions. Lungs CTAB.  No chest wall tenderness to palpation. Gastrointestinal: Soft and nontender. No distention. Genitourinary: deferred Musculoskeletal: No lower extremity tenderness nor edema. Neurologic:  Normal speech and language. No gross focal neurologic deficits are appreciated. Skin:  Skin is warm, dry and intact. No rash noted. Psychiatric: Mood and affect are normal. Speech and behavior are normal.  ____________________________________________   LABS (all labs ordered are listed, but only abnormal results are displayed)  Labs Reviewed  TROPONIN I (HIGH SENSITIVITY) - Abnormal; Notable for the following components:      Result Value   Troponin I (High Sensitivity) 371 (*)    All other components within normal limits  RESP PANEL BY RT-PCR (FLU A&B, COVID) ARPGX2  BASIC METABOLIC PANEL  CBC  HEMOGLOBIN A1C  URINE DRUG SCREEN, QUALITATIVE ([facility] ONLY)  PROTIME-INR  APTT  HEPARIN LEVEL (UNFRACTIONATED)  LIPID PANEL  CBC  COMPREHENSIVE METABOLIC PANEL  CBC  CREATININE, SERUM  TROPONIN I (HIGH SENSITIVITY)   ____________________________________________  EKG  ED ECG REPORT I, 08/04/20, the attending physician, personally viewed and interpreted this ECG.   Date: 08/02/2020  EKG Time: 13:20  Rate: 68  Rhythm: normal sinus rhythm  Axis: Normal  Intervals:none  ST&T Change: None   PROCEDURES  Procedure(s) performed (including Critical Care):  .Critical Care Performed by: 08/04/2020, MD Authorized by: Chesley Noon, MD   Critical care provider statement:    Critical care time (minutes):  45   Critical care time was exclusive of:  Separately billable procedures and treating other patients and  teaching time   Critical care was necessary to treat or prevent imminent or life-threatening deterioration of the following conditions:  Cardiac failure   Critical care was time spent personally by me on the following activities:  Discussions with consultants, evaluation of patient's response to treatment, examination of patient, ordering and performing treatments and interventions, ordering and review of laboratory studies, ordering and review of radiographic studies, pulse oximetry, re-evaluation of patient's condition, obtaining history from patient or surrogate and review of old charts   I assumed direction of critical care for this patient from another provider in my specialty: no     Care discussed with: admitting provider       ____________________________________________   INITIAL IMPRESSION / ASSESSMENT AND PLAN / ED COURSE       40 year old male with  possible history of hypertension and COPD who presents to the ED complaining of 4 to 5 days of intermittent left arm pain that worsened today and was associated with pain in the center of his chest.  EKG shows no evidence of arrhythmia or ischemia and patient states that pain is improving, although troponin noted to be elevated.  Presentation is concerning for NSTEMI, low suspicion for PE or dissection. We will give additional aspirin to complete loading dose, treat ongoing pain with nitroglycerin.  Case discussed with Dr. Kirke Corin of cardiology, who will evaluate the patient and agrees with plan for heparin.  Plan to discuss with hospitalist for admission.      ____________________________________________   FINAL CLINICAL IMPRESSION(S) / ED DIAGNOSES  Final diagnoses:  NSTEMI (non-ST elevated myocardial infarction) [facility])     ED Discharge Orders    None       Note:  This document was prepared using Dragon voice recognition software and may include unintentional dictation errors.   Chesley Noon, MD 08/02/20 [phone]

## 2020-08-02 NOTE — H&P (Signed)
History and Physical  TEYON ODETTE TUU:[id number] DOB: 04-21-1981 DOA: 08/02/2020  Referring physician: Dr. Larinda Buttery, EDP PCP: Patient, No Pcp Per  Outpatient Specialists: None Patient coming from: Home.  Chief Complaint: Chest pain  HPI: LIZZIE COKLEY is a 40 y.o. male with medical history significant for essential hypertension, tobacco and alcohol use disorder who presented to [facility] ED due to sudden onset chest pain that started earlier this morning while he was sitting down drinking coffee.  He describes it as severe burning pain centrally located radiating to his left arm.  200/10 in severity with 10 out of 10 pain in his left arm.  There were no improving or worsening factors.  He took 3 baby aspirins at home but the pain persisted.  He has a family history of early coronary artery disease in his father when he was in his 12s to 40s.  He presented to the ED for further evaluation.  While in the ED his blood pressure was markedly elevated, troponin greater than 300.  Received another dose of aspirin and was started on IV heparin.  Cardiology was consulted by EDP and made decision to take to the cath lab urgently for NSTEMI.  ED Course:  Afebrile, BP 194/110, pulse 70, respiratory rate 16, O2 saturation 98% on room air.  Lab studies remarkable for troponin 371, rest of labs were essentially unremarkable. Twelve-lead EKG showed normal sinus rhythm rate of 68 with non specific ST-T changes.  QTc 442.   Review of Systems: Review of systems as noted in the HPI. All other systems reviewed and are negative.    Past Medical History:  Diagnosis Date  . COPD (chronic obstructive pulmonary disease) (HCC)   . Essential hypertension    Past Surgical History:  Procedure Laterality Date  . HAND SURGERY     ORIF right ring finger  . KNEE SURGERY     arthroscopy right knee    Social History:  reports that he has been smoking cigarettes. He has been smoking about 1.00 pack per day. He has  never used smokeless tobacco. He reports current alcohol use of about 1.0 standard drink of alcohol per week. He reports current drug use. Drug: Marijuana.   Allergies  Allergen Reactions  . Penicillins Itching  . Vicodin [Hydrocodone-Acetaminophen] Itching    "makes me itch"    Family History  Problem Relation Age of Onset  . Colon cancer Mother   . CAD Father        a. 15s to 29s  . CAD Paternal Grandfather       Prior to Admission medications   Medication Sig Start Date End Date Taking? Authorizing Provider  methocarbamol (ROBAXIN) 500 MG tablet 1-2 tablets qid prn muscle spasms. 09/03/17   Tommi Rumps, PA-C  predniSONE (DELTASONE) 10 MG tablet Take 6 tablets  today, on day 2 take 5 tablets, day 3 take 4 tablets, day 4 take 3 tablets, day 5 take  2 tablets and 1 tablet the last day 09/03/17   Tommi Rumps, PA-C  traMADol (ULTRAM) 50 MG tablet Take 1 tablet (50 mg total) by mouth every 6 (six) hours as needed. 09/03/17   Tommi Rumps, PA-C    Physical Exam: BP (!) 169/93 (BP Location: Right Arm)   Pulse 73   Temp 97.7 F (36.5 C) (Oral)   Resp 18   Ht 5\' 11"  (1.803 m)   Wt 117.9 kg   SpO2 94%   BMI 36.26 kg/m   .  General: 40 y.o. year-old male well developed well nourished in no acute distress.  Alert and oriented x3. . Cardiovascular: Regular rate and rhythm with no rubs or gallops.  No thyromegaly or JVD noted.  No lower extremity edema. 2/4 pulses in all 4 extremities. Marland Kitchen Respiratory: Clear to auscultation with no wheezes or rales. Good inspiratory effort. . Abdomen: Soft nontender nondistended with normal bowel sounds x4 quadrants. . Muskuloskeletal: No cyanosis, clubbing or edema noted bilaterally . Neuro: CN II-XII intact, strength, sensation, reflexes . Skin: No ulcerative lesions noted or rashes . Psychiatry: Judgement and insight appear normal. Mood is appropriate for condition and setting          Labs on Admission:  Basic Metabolic  Panel: Recent Labs  Lab 08/02/20 1320  NA 141  K 4.3  CL 109  CO2 24  GLUCOSE 94  BUN 13  CREATININE 0.91  CALCIUM 8.9   Liver Function Tests: No results for input(s): AST, ALT, ALKPHOS, BILITOT, PROT, ALBUMIN in the last 168 hours. No results for input(s): LIPASE, AMYLASE in the last 168 hours. No results for input(s): AMMONIA in the last 168 hours. CBC: Recent Labs  Lab 08/02/20 1320  WBC 8.2  HGB 16.1  HCT 48.4  MCV 88.8  PLT 272   Cardiac Enzymes: No results for input(s): CKTOTAL, CKMB, CKMBINDEX, TROPONINI in the last 168 hours.  BNP (last 3 results) No results for input(s): BNP in the last 8760 hours.  ProBNP (last 3 results) No results for input(s): PROBNP in the last 8760 hours.  CBG: No results for input(s): GLUCAP in the last 168 hours.  Radiological Exams on Admission: DG Chest 2 View  Result Date: 08/02/2020 CLINICAL DATA:  Chest pain. EXAM: Chest-two view COMPARISON:  August 14, 2017 FINDINGS: No consolidation. No visible pleural effusions or pneumothorax. Cardiomediastinal silhouette is within normal limits. Approximately 6 mm nodular opacity in the left upper lung, likely the calcified nodule/granuloma seen on prior CT chest. IMPRESSION: 1. No evidence of acute cardiopulmonary disease. 2. Approximately 6 mm nodular opacity in the left upper lung, likely the calcified nodule/granuloma seen on prior CT chest. Electronically Signed   By: Feliberto Harts MD   On: 08/02/2020 17:02   CARDIAC CATHETERIZATION  Result Date: 08/02/2020  The left ventricular systolic function is normal.  LV end diastolic pressure is mildly elevated.  The left ventricular ejection fraction is 55-65% by visual estimate.  Prox LAD to Mid LAD lesion is 30% stenosed.  1st Diag lesion is 60% stenosed.  Mid Cx to Dist Cx lesion is 30% stenosed.  Ramus lesion is 30% stenosed.  1.  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  Worst stenosis is 60% in ostial first  diagonal. 2.  Normal LV systolic function mildly elevated left ventricular end-diastolic pressure. Recommendations: Suspect supply demand ischemia in the setting of uncontrolled hypertension. Recommend aggressive medical therapy for nonobstructive coronary artery disease, smoking cessation and blood pressure control. I added carvedilol and amlodipine for blood pressure control. I also added atorvastatin.    EKG: I independently viewed the EKG done and my findings are as followed: Twelve-lead EKG showed normal sinus rhythm rate of 68 with non specific ST-T changes.  QTc 442.   Assessment/Plan Present on Admission: . NSTEMI (non-ST elevated myocardial infarction) (HCC) . Hypertensive urgency . Tobacco use . Unstable angina [facility])  Active Problems:   NSTEMI (non-ST elevated myocardial infarction) (HCC)   Hypertensive urgency   Tobacco use   Unstable angina (HCC)  NSTEMI Presented with severe burning chest pain radiated to his left arm while at rest.   BP 194/110 on presentation Troponin 371.  Twelve-lead EKG no evidence of acute ischemia.   Received aspirin and IV heparin in the ED.  Was taken to the Cath Lab urgently. Left heart cath and coronary angiography on 08/02/2020 showing LVEF 55 to 65%, proximal LAD to mid LAD lesion 30% stenosis.  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  Worst stenosis is 60% in ostial first diagonal.   Cardiology recommended aggressive medical therapy for nonobstructive coronary artery disease, smoking cessation and blood pressure control. Obtain fasting lipid panel in the morning. Continue aspirin 81 mg daily, Lipitor 80 mg daily, Coreg 12.5 mg twice daily, amlodipine 5 mg daily.  Hypertensive emergency Presented with markedly elevated BP and elevated troponin Received IV labetalol Started on Norvasc 5 mg daily, Coreg 12.5 mg twice daily by cardiology. Continue to closely monitor vital signs.  Alcohol use disorder/tobacco use  disorder Polysubstance cessation counseling done at bedside. No evidence of alcohol withdrawal at time of this visit. CIWA protocol  Situational insomnia 05 mg p.o. Ativan x1 dose tonight     DVT prophylaxis: Subcu Lovenox daily.  Code Status: Full code.  Family Communication: None at bedside.  Disposition Plan: Progressive cardiac unit.  Consults called: Cardiology.  Admission status: Inpatient status.  Patient will require at least 2 midnights for further evaluation and treatment of present condition.   Status is: Inpatient    Dispo:  Patient From: Home  Planned Disposition: Home  Anticipated date of discharge 08/04/2020 or when cardiologist signs off.  Medically stable for discharge: No, ongoing management of NSTEMI, post left heart catheterization.         Darlin Drop MD [facility] Pager [phone]  If 7PM-7AM, please contact night-coverage [email or web address] Password [facility]  08/02/2020, 9:08 PM

## 2020-08-02 NOTE — Consult Note (Signed)
ANTICOAGULATION CONSULT NOTE - Initial Consult  Pharmacy Consult for Heparin Infusion Indication: chest pain/ACS  Allergies  Allergen Reactions  . Penicillins Itching  . Vicodin [Hydrocodone-Acetaminophen] Itching    "makes me itch"    Patient Measurements: Height: 5\' 11"  (180.3 cm) Weight: 117.9 kg (260 lb) IBW/kg (Calculated) : 75.3 Heparin Dosing Weight: 101.3 kg  Vital Signs: Temp: 97.9 F (36.6 C) (03/18 1320) Temp Source: Oral (03/18 1320) BP: 178/112 (03/18 1542) Pulse Rate: 74 (03/18 1542)  Labs: Recent Labs    08/02/20 1320  HGB 16.1  HCT 48.4  PLT 272  CREATININE 0.91  TROPONINIHS 371*    Estimated Creatinine Clearance: 138.1 mL/min (by C-G formula based on SCr of 0.91 mg/dL).   Medical History: Past Medical History:  Diagnosis Date  . COPD (chronic obstructive pulmonary disease) (HCC)   . Essential hypertension     Medications:  No history of anticoagulant use PTA  Assessment: Pharmacy has been consulted to initiate heparin infusion on 40yo male with NSTEMI. Initial troponin level of 371. No history of anticoagulant use PTA. Baseline labs have been ordered and are pending.  Goal of Therapy:  Heparin level 0.3-0.7 units/ml Monitor platelets by anticoagulation protocol: Yes   Plan:  Give 4000 units bolus x 1 Start heparin infusion at 1400 units/hr Check anti-Xa level in 6 hours and daily while on heparin Continue to monitor H&H and platelets  Alejandro Gibson Alejandro Gibson 08/02/2020,3:59 PM

## 2020-08-02 NOTE — ED Triage Notes (Signed)
Pt states cp/burning, pain radiating to left arm "ach" in his left shoulder as well. States it feels like "an extreme case of indigestion." Has been very tired today as well. Ambulatory to triage, NAD.

## 2020-08-02 NOTE — Consult Note (Addendum)
Cardiology Consultation:   Patient ID: Alejandro Gibson; [id number]; 07-May-1981   Admit date: 08/02/2020 Date of Consult: 08/02/2020  Primary Care Provider: Patient, No Pcp Per Primary Cardiologist: New to [facility] - consult by Arida Primary Electrophysiologist:  None   Patient Profile:   Alejandro Gibson is a 40 y.o. male with a hx of premature CAD on his father side of the family, HTN, COPD, ongoing tobacco use at 1 pack/day for 30 years, marijuana use, and history of significant alcohol abuse requiring prior rehab who is being seen today for the evaluation of NSTEMI at the request of Dr. Larinda Buttery.  History of Present Illness:   Alejandro Gibson has no previously known cardiac history.  In reviewing Epic it appears he receives the majority of his medical care through the ED. he reports a significant family history of premature CAD on his father side of the family with every male having had MIs, PCI, or CABG in their 65s to 67s.  He does report a history of hypertension and COPD for which he has been prescribed medication for both though does not take.  Over the past week he has been experiencing exertional left arm discomfort which he initially attributed to frequent driving with his job.  With this he has noted some increased fatigue, dyspnea, and diaphoresis.  Throughout the day on 3/17 he was experiencing substernal chest discomfort that felt like a "burning."  He further states he felt like "something was blocked."  With exertion, symptoms worsened and would radiate down the left arm.  There has been some associated diaphoresis, nausea, vomiting, and dyspnea.  No dizziness, presyncope, or syncope.  He has never experienced symptoms similar.  This morning he continued to note chest discomfort and attempted to smoke a cigarette and drink a beer to help, however symptoms persisted.  Chest pain does not radiate through his chest to his back.  Given his discomfort was rated a 9 out of 10 he drove  himself to the ED.  Prior to his arrival, he took three ASA 81 mg.   Upon the patient's arrival to [facility] they were found to have BP 194/110, HR 70 bpm, temp afebrile, oxygen saturation 98% on room air, weight 117.9 kg. EKG showed NSR, 68 bpm, normal axis, no acute ST-T changes, CXR pending. Labs showed an initial high-sensitivity troponin of 371, unremarkable CBC and BMP.  He has IV labetalol, ASA 81 mg x 1, and heparin ordered in the ED.  Currently at time of cardiology consult he continues to note 6 out of 10 chest pain that radiates down the left arm.  Past Medical History:  Diagnosis Date  . COPD (chronic obstructive pulmonary disease) (HCC)   . Essential hypertension     Past Surgical History:  Procedure Laterality Date  . HAND SURGERY     ORIF right ring finger  . KNEE SURGERY     arthroscopy right knee     Home Meds: Prior to Admission medications   Medication Sig Start Date End Date Taking? Authorizing Provider  methocarbamol (ROBAXIN) 500 MG tablet 1-2 tablets qid prn muscle spasms. 09/03/17   Tommi Rumps, PA-C  predniSONE (DELTASONE) 10 MG tablet Take 6 tablets  today, on day 2 take 5 tablets, day 3 take 4 tablets, day 4 take 3 tablets, day 5 take  2 tablets and 1 tablet the last day 09/03/17   Tommi Rumps, PA-C  traMADol (ULTRAM) 50 MG tablet Take 1 tablet (50  mg total) by mouth every 6 (six) hours as needed. 09/03/17   Tommi Rumps, PA-C    Inpatient Medications: Scheduled Meds: . heparin  4,000 Units Intravenous Once  . nicotine  14 mg Transdermal Daily   Continuous Infusions: . heparin     PRN Meds: nitroGLYCERIN  Allergies:   Allergies  Allergen Reactions  . Penicillins Itching  . Vicodin [Hydrocodone-Acetaminophen] Itching    "makes me itch"    Social History:   Social History   Socioeconomic History  . Marital status: Married    Spouse name: Not on file  . Number of children: Not on file  . Years of education: Not on file  . Highest  education level: Not on file  Occupational History  . Not on file  Tobacco Use  . Smoking status: Current Every Day Smoker    Packs/day: 1.00    Types: Cigarettes  . Smokeless tobacco: Never Used  Substance and Sexual Activity  . Alcohol use: Yes    Alcohol/week: 1.0 standard drink    Types: 1 Cans of beer per week  . Drug use: Yes    Types: Marijuana  . Sexual activity: Not on file  Other Topics Concern  . Not on file  Social History Narrative  . Not on file   Social Determinants of Health   Financial Resource Strain: Not on file  Food Insecurity: Not on file  Transportation Needs: Not on file  Physical Activity: Not on file  Stress: Not on file  Social Connections: Not on file  Intimate Partner Violence: Not on file     Family History:   Family History  Problem Relation Age of Onset  . Colon cancer Mother   . CAD Father        a. 29s to 75s  . CAD Paternal Grandfather     ROS:  Review of Systems  Constitutional: Positive for diaphoresis and malaise/fatigue. Negative for chills, fever and weight loss.  HENT: Negative for congestion.   Eyes: Negative for discharge and redness.  Respiratory: Positive for shortness of breath. Negative for cough, sputum production and wheezing.   Cardiovascular: Positive for chest pain. Negative for palpitations, orthopnea, claudication, leg swelling and PND.  Gastrointestinal: Positive for nausea and vomiting. Negative for abdominal pain and heartburn.  Musculoskeletal: Negative for falls and myalgias.  Skin: Negative for rash.  Neurological: Positive for weakness. Negative for dizziness, tingling, tremors, sensory change, speech change, focal weakness and loss of consciousness.  Endo/Heme/Allergies: Does not bruise/bleed easily.  Psychiatric/Behavioral: Positive for substance abuse. The patient is not nervous/anxious.   All other systems reviewed and are negative.     Physical Exam/Data:   Vitals:   08/02/20 1317 08/02/20  1320  BP:  (!) 194/110  Pulse:  70  Resp:  16  Temp:  97.9 F (36.6 C)  TempSrc:  Oral  SpO2:  98%  Weight: 117.9 kg   Height: 5\' 11"  (1.803 m)    No intake or output data in the 24 hours ending 08/02/20 1542 Filed Weights   08/02/20 1317  Weight: 117.9 kg   Body mass index is 36.26 kg/m.   Physical Exam: General: Well developed, well nourished, in no acute distress. Head: Normocephalic, atraumatic, sclera non-icteric, no xanthomas, nares without discharge.  Neck: Negative for carotid bruits. JVD not elevated. Lungs: Clear bilaterally to auscultation without wheezes, rales, or rhonchi. Breathing is unlabored. Heart: RRR with S1 S2. No murmurs, rubs, or gallops appreciated. Abdomen: Soft, non-tender, non-distended  with normoactive bowel sounds. No hepatomegaly. No rebound/guarding. No obvious abdominal masses. Msk:  Strength and tone appear normal for age. Extremities: No clubbing or cyanosis. No edema. Distal pedal pulses are 2+ and equal bilaterally. Neuro: Alert and oriented X 3. No facial asymmetry. No focal deficit. Moves all extremities spontaneously. Psych:  Responds to questions appropriately with a normal affect.   EKG:  The EKG was personally reviewed and demonstrates: NSR, 68 bpm, normal axis, no acute ST-T changes Telemetry:  Telemetry was personally reviewed and demonstrates: not on tele  Weights: Filed Weights   08/02/20 1317  Weight: 117.9 kg    Relevant CV Studies:  None available for review in Epic  Laboratory Data:  Chemistry Recent Labs  Lab 08/02/20 1320  NA 141  K 4.3  CL 109  CO2 24  GLUCOSE 94  BUN 13  CREATININE 0.91  CALCIUM 8.9  GFRNONAA >60  ANIONGAP 8    No results for input(s): PROT, ALBUMIN, AST, ALT, ALKPHOS, BILITOT in the last 168 hours. Hematology Recent Labs  Lab 08/02/20 1320  WBC 8.2  RBC 5.45  HGB 16.1  HCT 48.4  MCV 88.8  MCH 29.5  MCHC 33.3  RDW 13.2  PLT 272   Cardiac EnzymesNo results for input(s):  TROPONINI in the last 168 hours. No results for input(s): TROPIPOC in the last 168 hours.  BNPNo results for input(s): BNP, PROBNP in the last 168 hours.  DDimer No results for input(s): DDIMER in the last 168 hours.  Radiology/Studies:  No results found.  Assessment and Plan:   1.  NSTEMI: -Presented to the ED on 3/18 with a 1 week history of exertional left arm pain and a 1 day history of severe substernal chest discomfort rated a 9 out of 10 prior to admission -He continues to note 6 out of 10 chest pain following ASA 81 mg x 3 at home -Initial high-sensitivity troponin 371 with delta pending, continue to cycle until peak -Significant family history of premature CAD in males on his father's side of the family -He took ASA 81 mg x 3 prior to arrival with another 81 mg ordered in the ED -No radiation of symptoms through to his back -Start heparin drip -Given ongoing angina, plan for urgent LHC this afternoon with Dr. Kirke Corin -Echo -Check A1c and lipid panel for further risk stratification  -Further recommendations pending LHC -Risks and benefits of cardiac catheterization have been discussed with the patient including risks of bleeding, bruising, infection, kidney damage, stroke, heart attack, urgent need for bypass, injury to limb, and death. The patient understands these risks and is willing to proceed with the procedure. All questions have been answered and concerns listened to  2.  Hypertensive urgency: -IV labetalol  -Escalate antihypertensive therapy post-cath -Check urine drug screen (reports marijuana use)  3.  COPD with ongoing tobacco use: -No active exacerbation -Nicotine patches during admission -Complete cessation recommended  4.  History of alcohol abuse: -He reports a significant history of drinking a fifth to half gallon of alcohol per day and has gone through rehab -Currently drinks 2 to 3, twelve ounce beers per day   For questions or updates, please contact  [facility] Please consult [email or web address] for contact info under Cardiology/STEMI.   Signed, Eula Listen, PA-C [facility] Pager: [phone] 08/02/2020, 3:42 PM

## 2020-08-02 NOTE — Progress Notes (Signed)
Patient transferred to 2A via patient bed on cardiac monitoring. Belongings at bedside. Denies chest pain at this time.

## 2020-08-03 ENCOUNTER — Inpatient Hospital Stay (HOSPITAL_BASED_OUTPATIENT_CLINIC_OR_DEPARTMENT_OTHER)
Admit: 2020-08-03 | Discharge: 2020-08-03 | Disposition: A | Discharge status: Home / Self Care | Payer: Medicaid Other | Attending: Physician Assistant | Admitting: Physician Assistant

## 2020-08-03 DIAGNOSIS — I214 Non-ST elevation (NSTEMI) myocardial infarction: Secondary | ICD-10-CM | POA: Diagnosis not present

## 2020-08-03 DIAGNOSIS — E785 Hyperlipidemia, unspecified: Secondary | ICD-10-CM | POA: Diagnosis not present

## 2020-08-03 DIAGNOSIS — I160 Hypertensive urgency: Secondary | ICD-10-CM | POA: Diagnosis not present

## 2020-08-03 DIAGNOSIS — Z720 Tobacco use: Secondary | ICD-10-CM | POA: Diagnosis not present

## 2020-08-03 LAB — COMPREHENSIVE METABOLIC PANEL
ALT: 30 U/L (ref 0–44)
AST: 48 U/L — ABNORMAL HIGH (ref 15–41)
Albumin: 3.7 g/dL (ref 3.5–5.0)
Alkaline Phosphatase: 76 U/L (ref 38–126)
Anion gap: 7 (ref 5–15)
BUN: 10 mg/dL (ref 6–20)
CO2: 23 mmol/L (ref 22–32)
Calcium: 8.5 mg/dL — ABNORMAL LOW (ref 8.9–10.3)
Chloride: 107 mmol/L (ref 98–111)
Creatinine, Ser: 0.93 mg/dL (ref 0.61–1.24)
GFR, Estimated: >60 mL/min (ref >60)
Glucose, Bld: 104 mg/dL — ABNORMAL HIGH (ref 70–99)
Potassium: 3.8 mmol/L (ref 3.5–5.1)
Sodium: 137 mmol/L (ref 135–145)
Total Bilirubin: 0.6 mg/dL (ref 0.3–1.2)
Total Protein: 6.6 g/dL (ref 6.5–8.1)

## 2020-08-03 LAB — CBC
HCT: 46.5 % (ref 39.0–52.0)
Hemoglobin: 15.4 g/dL (ref 13.0–17.0)
MCH: 29.3 pg (ref 26.0–34.0)
MCHC: 33.1 g/dL (ref 30.0–36.0)
MCV: 88.6 fL (ref 80.0–100.0)
Platelets: 255 10*3/uL (ref 150–400)
RBC: 5.25 MIL/uL (ref 4.22–5.81)
RDW: 13.1 % (ref 11.5–15.5)
WBC: 9.6 10*3/uL (ref 4.0–10.5)
nRBC: 0 % (ref 0.0–0.2)

## 2020-08-03 LAB — ECHOCARDIOGRAM COMPLETE
AR max vel: 2.21 cm2
AV Peak grad: 10.2 mmHg
Ao pk vel: 1.6 m/s
Area-P 1/2: 3.15 cm2
Height: 71 in
S' Lateral: 3.64 cm
Weight: 4025.6 oz

## 2020-08-03 LAB — LIPID PANEL
Cholesterol: 209 mg/dL — ABNORMAL HIGH (ref 0–200)
HDL: 32 mg/dL — ABNORMAL LOW (ref >40)
LDL Cholesterol: 144 mg/dL — ABNORMAL HIGH (ref 0–99)
Total CHOL/HDL Ratio: 6.5 RATIO
Triglycerides: 167 mg/dL — ABNORMAL HIGH (ref <150)
VLDL: 33 mg/dL (ref 0–40)

## 2020-08-03 MED ORDER — HEPARIN BOLUS VIA INFUSION
3000.0000 [IU] | Freq: Once | INTRAVENOUS | Status: DC
  Filled 2020-08-03: qty 3000

## 2020-08-03 MED ORDER — ATORVASTATIN CALCIUM 80 MG PO TABS: 80.0000 mg | ORAL_TABLET | Freq: Every day | ORAL | 3 refills | Status: DC

## 2020-08-03 MED ORDER — NITROGLYCERIN 0.4 MG SL SUBL: 0.4000 mg | SUBLINGUAL_TABLET | SUBLINGUAL | 12 refills | Status: DC | PRN

## 2020-08-03 MED ORDER — CARVEDILOL 12.5 MG PO TABS: 12.5000 mg | ORAL_TABLET | Freq: Two times a day (BID) | ORAL | 3 refills | Status: DC

## 2020-08-03 MED ORDER — FOLIC ACID 1 MG PO TABS: 1.0000 mg | ORAL_TABLET | Freq: Every day | ORAL | 0 refills | Status: AC

## 2020-08-03 MED ORDER — AMLODIPINE BESYLATE 10 MG PO TABS: 10.0000 mg | ORAL_TABLET | Freq: Every day | ORAL | 3 refills | Status: DC

## 2020-08-03 MED ORDER — ADULT MULTIVITAMIN W/MINERALS CH: 1.0000 | ORAL_TABLET | Freq: Every day | ORAL | 2 refills | Status: AC

## 2020-08-03 MED ORDER — ENOXAPARIN SODIUM 40 MG/0.4ML ~~LOC~~ SOLN
40.0000 mg | SUBCUTANEOUS | Status: DC
  Administered 2020-08-03: 40 mg via SUBCUTANEOUS
  Filled 2020-08-03: qty 0.4

## 2020-08-03 MED ORDER — ASPIRIN 81 MG PO TBEC
81.0000 mg | DELAYED_RELEASE_TABLET | Freq: Every day | ORAL | 11 refills | Status: AC
Start: 2020-08-04 — End: ?

## 2020-08-03 MED ORDER — AMLODIPINE BESYLATE 5 MG PO TABS
5.0000 mg | ORAL_TABLET | Freq: Once | ORAL | Status: AC
  Administered 2020-08-03: 5 mg via ORAL
  Filled 2020-08-03: qty 1

## 2020-08-03 MED ORDER — NICOTINE 14 MG/24HR TD PT24: 14.0000 mg | MEDICATED_PATCH | Freq: Every day | TRANSDERMAL | 0 refills | Status: AC

## 2020-08-03 MED ORDER — AMLODIPINE BESYLATE 10 MG PO TABS: 10.0000 mg | ORAL_TABLET | Freq: Every day | ORAL | Status: DC

## 2020-08-03 NOTE — Discharge Summary (Signed)
[facility] [place] [facility]   PATIENT NAME: Alejandro Gibson    MR#:  [id number]  DATE OF BIRTH:  07/19/1980  DATE OF ADMISSION:  08/02/2020 ADMITTING PHYSICIAN: Darlin Droparole N Hall, DO  DATE OF DISCHARGE:08/03/2020  PRIMARY CARE PHYSICIAN: Patient, No Pcp Per    ADMISSION DIAGNOSIS:  NSTEMI (non-ST elevated myocardial infarction) (HCC) [I21.4] Unstable angina (HCC) [I20.0]  DISCHARGE DIAGNOSIS:  NSTEMI s/p cath-- medical management  SECONDARY DIAGNOSIS:   Past Medical History:  Diagnosis Date  . COPD (chronic obstructive pulmonary disease) (HCC)   . Essential hypertension     HOSPITAL COURSE:  Shawn RouteDaniel W Badley is a 40 y.o. male with medical history significant for essential hypertension, tobacco and alcohol use disorder who presented to [facility] ED due to sudden onset chest pain that started earlier this morning while he was sitting down drinking coffee.  NSTEMI-acute --Presented with severe burning chest pain radiated to his left arm while at rest.   --BP 194/110 on presentation --Troponin 371--2000 --  Twelve-lead EKG no evidence of acute ischemia.   --Received aspirin and IV heparin in the ED.   --s/p cardiac cath by Dr Kirke CorinArida showed Left heart cath and coronary angiography on 08/02/2020 showing LVEF 55 to 65%, proximal LAD to mid LAD lesion 30% stenosis.  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  Worst stenosis is 60% in ostial first diagonal.   --Cardiology recommended aggressive medical therapy for nonobstructive coronary artery disease, smoking cessation and blood pressure control. Continue aspirin 81 mg daily, Lipitor 80 mg daily, Coreg 12.5 mg twice daily, amlodipine 10 mg daily. --3/19-- pt says " I feels great" I am ready to make some changes in my lifestyle  Hypertensive emergency -Presented with markedly elevated BP and elevated troponin -Received IV labetalol --Tolerating  Norvasc 10 mg daily, Coreg 12.5 mg twice daily  --BP  improving  Alcohol use disorder/tobacco use disorder/Marijuana use --Polysubstance cessation counseling done at bedside. --No evidence of alcohol withdrawal at time of this visit. --pt was placed on CIWA protocol  Hyperlipidemia --cont Statins --dietary instructions given  DVT prophylaxis: Subcu Lovenox daily.  Code Status: Full code.  Family Communication: None at bedside.  Disposition Plan: to home today. D/w Alycia Rossettiyan and Dr Azucena CecilAgbor-etang and they are agreeable with plan  Consults called: Cardiology.  Admission status: Inpatient status.   Dispo:             Patient From: Home             Planned Disposition: Home  Anticipated date of discharge today             Medically stable for discharge. Pt agreeable   CONSULTS OBTAINED:  Treatment Team:  Iran OuchArida, Muhammad A, MD  DRUG ALLERGIES:   Allergies  Allergen Reactions  . Penicillins Itching  . Vicodin [Hydrocodone-Acetaminophen] Itching    "makes me itch"    DISCHARGE MEDICATIONS:   Allergies as of 08/03/2020      Reactions   Penicillins Itching   Vicodin [hydrocodone-acetaminophen] Itching   "makes me itch"      Medication List    STOP taking these medications   predniSONE 10 MG tablet Commonly known as: DELTASONE   traMADol 50 MG tablet Commonly known as: Ultram     TAKE these medications   amLODipine 10 MG tablet Commonly known as: NORVASC Take 1 tablet (10 mg total) by mouth daily. Start taking on: August 04, 2020   aspirin 81 MG EC tablet Take 1  tablet (81 mg total) by mouth daily. Swallow whole. Start taking on: August 04, 2020   atorvastatin 80 MG tablet Commonly known as: LIPITOR Take 1 tablet (80 mg total) by mouth at bedtime.   carvedilol 12.5 MG tablet Commonly known as: COREG Take 1 tablet (12.5 mg total) by mouth 2 (two) times daily with a meal.   folic acid 1 MG tablet Commonly known as: FOLVITE Take 1 tablet (1 mg total) by mouth daily. Start taking on: August 04, 2020   methocarbamol 500 MG tablet Commonly known as: Robaxin 1-2 tablets qid prn muscle spasms.   multivitamin with minerals Tabs tablet Take 1 tablet by mouth daily. Start taking on: August 04, 2020   nicotine 14 mg/24hr patch Commonly known as: NICODERM CQ - dosed in mg/24 hours Place 1 patch (14 mg total) onto the skin daily. Start taking on: August 04, 2020   nitroGLYCERIN 0.4 MG SL tablet Commonly known as: NITROSTAT Place 1 tablet (0.4 mg total) under the tongue every 5 (five) minutes as needed for chest pain.       If you experience worsening of your admission symptoms, develop shortness of breath, life threatening emergency, suicidal or homicidal thoughts you must seek medical attention immediately by calling 911 or calling your MD immediately  if symptoms less severe.  You Must read complete instructions/literature along with all the possible adverse reactions/side effects for all the Medicines you take and that have been prescribed to you. Take any new Medicines after you have completely understood and accept all the possible adverse reactions/side effects.   Please note  You were cared for by a hospitalist during your hospital stay. If you have any questions about your discharge medications or the care you received while you were in the hospital after you are discharged, you can call the unit and asked to speak with the hospitalist on call if the hospitalist that took care of you is not available. Once you are discharged, your primary care physician will handle any further medical issues. Please note that NO REFILLS for any discharge medications will be authorized once you are discharged, as it is imperative that you return to your primary care physician (or establish a relationship with a primary care physician if you do not have one) for your aftercare needs so that they can reassess your need for medications and monitor your lab values. Today   SUBJECTIVE   Doing well. I  feel great doc!  VITAL SIGNS:  Blood pressure (!) 164/96, pulse 73, temperature 97.6 F (36.4 C), temperature source Oral, resp. rate 18, height 5\' 11"  (1.803 m), weight 114.1 kg, SpO2 93 %.  I/O:    Intake/Output Summary (Last 24 hours) at 08/03/2020 1004 Last data filed at 08/03/2020 0640 Gross per 24 hour  Intake 740.44 ml  Output 3005 ml  Net -2264.56 ml    PHYSICAL EXAMINATION:  GENERAL:  40 y.o.-year-old patient lying in the bed with no acute distress.  EYES: Pupils equal, round, reactive to light and accommodation. No scleral icterus.  HEENT: Head atraumatic, normocephalic. Oropharynx and nasopharynx clear.  NECK:  Supple, no jugular venous distention. No thyroid enlargement, no tenderness.  LUNGS: Normal breath sounds bilaterally, no wheezing, rales,rhonchi or crepitation. No use of accessory muscles of respiration.  CARDIOVASCULAR: S1, S2 normal. No murmurs, rubs, or gallops.  ABDOMEN: Soft, non-tender, non-distended. Bowel sounds present. No organomegaly or mass.  EXTREMITIES: No pedal edema, cyanosis, or clubbing.  NEUROLOGIC: non focal PSYCHIATRIC: The patient  is alert and oriented x 3.  SKIN: No obvious rash, lesion, or ulcer.   DATA REVIEW:   CBC  Recent Labs  Lab 08/03/20 0453  WBC 9.6  HGB 15.4  HCT 46.5  PLT 255    Chemistries  Recent Labs  Lab 08/02/20 2221 08/03/20 0453  NA  --  137  K  --  3.8  CL  --  107  CO2  --  23  GLUCOSE  --  104*  BUN  --  10  CREATININE  --  0.93  CALCIUM  --  8.5*  MG 2.2  --   AST  --  48*  ALT  --  30  ALKPHOS  --  76  BILITOT  --  0.6    Microbiology Results   Recent Results (from the past 240 hour(s))  Resp Panel by RT-PCR (Flu A&B, Covid) Nasopharyngeal Swab     Status: None   Collection Time: 08/02/20  3:22 PM   Specimen: Nasopharyngeal Swab; Nasopharyngeal(NP) swabs in vial transport medium  Result Value Ref Range Status   SARS Coronavirus 2 by RT PCR NEGATIVE NEGATIVE Final    Comment:  (NOTE) SARS-CoV-2 target nucleic acids are NOT DETECTED.  The SARS-CoV-2 RNA is generally detectable in upper respiratory specimens during the acute phase of infection. The lowest concentration of SARS-CoV-2 viral copies this assay can detect is 138 copies/mL. A negative result does not preclude SARS-Cov-2 infection and should not be used as the sole basis for treatment or other patient management decisions. A negative result may occur with  improper specimen collection/handling, submission of specimen other than nasopharyngeal swab, presence of viral mutation(s) within the areas targeted by this assay, and inadequate number of viral copies(<138 copies/mL). A negative result must be combined with clinical observations, patient history, and epidemiological information. The expected result is Negative.  Fact Sheet for Patients:  BloggerCourse.com  Fact Sheet for Healthcare Providers:  SeriousBroker.it  This test is no t yet approved or cleared by the [place] FDA and  has been authorized for detection and/or diagnosis of SARS-CoV-2 by FDA under an Emergency Use Authorization (EUA). This EUA will remain  in effect (meaning this test can be used) for the duration of the COVID-19 declaration under Section 564(b)(1) of the Act, 21 U.S.C.section 360bbb-3(b)(1), unless the authorization is terminated  or revoked sooner.       Influenza A by PCR NEGATIVE NEGATIVE Final   Influenza B by PCR NEGATIVE NEGATIVE Final    Comment: (NOTE) The Xpert Xpress SARS-CoV-2/FLU/RSV plus assay is intended as an aid in the diagnosis of influenza from Nasopharyngeal swab specimens and should not be used as a sole basis for treatment. Nasal washings and aspirates are unacceptable for Xpert Xpress SARS-CoV-2/FLU/RSV testing.  Fact Sheet for Patients: BloggerCourse.com  Fact Sheet for Healthcare  Providers: SeriousBroker.it  This test is not yet approved or cleared by the [place] FDA and has been authorized for detection and/or diagnosis of SARS-CoV-2 by FDA under an Emergency Use Authorization (EUA). This EUA will remain in effect (meaning this test can be used) for the duration of the COVID-19 declaration under Section 564(b)(1) of the Act, 21 U.S.C. section 360bbb-3(b)(1), unless the authorization is terminated or revoked.  Performed at [facility], [address], [place], [place] [address]     RADIOLOGY:  DG Chest 2 View  Result Date: 08/02/2020 CLINICAL DATA:  Chest pain. EXAM: Chest-two view COMPARISON:  August 14, 2017 FINDINGS: No consolidation.  No visible pleural effusions or pneumothorax. Cardiomediastinal silhouette is within normal limits. Approximately 6 mm nodular opacity in the left upper lung, likely the calcified nodule/granuloma seen on prior CT chest. IMPRESSION: 1. No evidence of acute cardiopulmonary disease. 2. Approximately 6 mm nodular opacity in the left upper lung, likely the calcified nodule/granuloma seen on prior CT chest. Electronically Signed   By: Feliberto Harts MD   On: 08/02/2020 17:02   CARDIAC CATHETERIZATION  Result Date: 08/02/2020  The left ventricular systolic function is normal.  LV end diastolic pressure is mildly elevated.  The left ventricular ejection fraction is 55-65% by visual estimate.  Prox LAD to Mid LAD lesion is 30% stenosed.  1st Diag lesion is 60% stenosed.  Mid Cx to Dist Cx lesion is 30% stenosed.  Ramus lesion is 30% stenosed.  1.  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  Worst stenosis is 60% in ostial first diagonal. 2.  Normal LV systolic function mildly elevated left ventricular end-diastolic pressure. Recommendations: Suspect supply demand ischemia in the setting of uncontrolled hypertension. Recommend aggressive medical therapy for nonobstructive coronary  artery disease, smoking cessation and blood pressure control. I added carvedilol and amlodipine for blood pressure control. I also added atorvastatin.     CODE STATUS:     Code Status Orders  (From admission, onward)         Start     Ordered   08/02/20 1619  Full code  Continuous        08/02/20 1618        Code Status History    This patient has a current code status but no historical code status.   Advance Care Planning Activity       TOTAL TIME TAKING CARE OF THIS PATIENT: *35* minutes.    Enedina Finner M.D  [facility]    CC: Primary care physician; Patient, No Pcp Per

## 2020-08-03 NOTE — Progress Notes (Addendum)
Pt was admitted with no signs of distress. Pt alert x 4. VSS except BP 169/93 HR 116. Pt was educated about safety and ascom within pt reach.

## 2020-08-03 NOTE — Progress Notes (Signed)
Patient discharged per order, PIV/tele removed from patient. AVS reviewed. Patient declined wheelchair walked down to main entrance by tech.

## 2020-08-03 NOTE — Plan of Care (Signed)
  Problem: Education: Goal: Understanding of cardiac disease, CV risk reduction, and recovery process will improve Outcome: Adequate for Discharge Goal: Individualized Educational Video(s) Outcome: Adequate for Discharge   Problem: Activity: Goal: Ability to tolerate increased activity will improve Outcome: Adequate for Discharge   Problem: Cardiac: Goal: Ability to achieve and maintain adequate cardiovascular perfusion will improve Outcome: Adequate for Discharge   Problem: Health Behavior/Discharge Planning: Goal: Ability to safely manage health-related needs after discharge will improve Outcome: Adequate for Discharge   

## 2020-08-03 NOTE — Progress Notes (Signed)
Progress Note  Patient Name: Alejandro Gibson Date of Encounter: 08/03/2020  Primary Cardiologist: New to [facility] - consult by Kirke Corin  Subjective   LHC 3/18 showed mild to moderate nonobstructive CAD. Elevated troponin suspected to be supply demand ischemia in the setting of uncontrolled HTN. BP is improving, though remains elevated. UDS remains pending. No further chest pain.   Inpatient Medications    Scheduled Meds: . amLODipine  5 mg Oral Daily  . aspirin EC  81 mg Oral Daily  . atorvastatin  80 mg Oral QHS  . carvedilol  12.5 mg Oral BID WC  . enoxaparin (LOVENOX) injection  40 mg Subcutaneous Q24H  . folic acid  1 mg Oral Daily  . multivitamin with minerals  1 tablet Oral Daily  . nicotine  14 mg Transdermal Daily  . sodium chloride flush  3 mL Intravenous Q12H  . thiamine  100 mg Oral Daily   Or  . thiamine  100 mg Intravenous Daily   Continuous Infusions: . sodium chloride     PRN Meds: sodium chloride, labetalol, LORazepam **OR** LORazepam, nitroGLYCERIN, ondansetron (ZOFRAN) IV, sodium chloride flush   Vital Signs    Vitals:   08/03/20 0124 08/03/20 0305 08/03/20 0413 08/03/20 0634  BP: (!) 165/104 (!) 166/92 (!) 169/100 (!) 155/89  Pulse:   70   Resp:   18   Temp:  98.6 F (37 C) 98.1 F (36.7 C)   TempSrc:  Oral    SpO2:  96% 93%   Weight:   114.1 kg   Height:        Intake/Output Summary (Last 24 hours) at 08/03/2020 0807 Last data filed at 08/03/2020 0640 Gross per 24 hour  Intake 740.44 ml  Output 3005 ml  Net -2264.56 ml   Filed Weights   08/02/20 1317 08/03/20 0413  Weight: 117.9 kg 114.1 kg    Telemetry    SR, 60s bpm - Personally Reviewed  ECG    No new tracings - Personally Reviewed  Physical Exam   GEN: No acute distress.   Neck: No JVD. Cardiac: RRR, no murmurs, rubs, or gallops. Right radial cardiac cath site is well healing without active bleeding, swelling, warmth, erythema, or TTP. Radial pulse 2+.   Respiratory:  Clear to auscultation bilaterally.  GI: Soft, nontender, non-distended.   MS: No edema; No deformity. Neuro:  Alert and oriented x 3; Nonfocal.  Psych: Normal affect.  Labs    Chemistry Recent Labs  Lab 08/02/20 1320 08/03/20 0453  NA 141 137  K 4.3 3.8  CL 109 107  CO2 24 23  GLUCOSE 94 104*  BUN 13 10  CREATININE 0.91 0.93  CALCIUM 8.9 8.5*  PROT  --  6.6  ALBUMIN  --  3.7  AST  --  48*  ALT  --  30  ALKPHOS  --  76  BILITOT  --  0.6  GFRNONAA >60 >60  ANIONGAP 8 7     Hematology Recent Labs  Lab 08/02/20 1320 08/02/20 2221 08/03/20 0453  WBC 8.2 7.2 9.6  RBC 5.45 5.26 5.25  HGB 16.1 15.1 15.4  HCT 48.4 46.6 46.5  MCV 88.8 88.6 88.6  MCH 29.5 28.7 29.3  MCHC 33.3 32.4 33.1  RDW 13.2 13.0 13.1  PLT 272 269 255    Cardiac EnzymesNo results for input(s): TROPONINI in the last 168 hours. No results for input(s): TROPIPOC in the last 168 hours.   BNPNo results for input(s): BNP,  PROBNP in the last 168 hours.   DDimer No results for input(s): DDIMER in the last 168 hours.   Radiology    DG Chest 2 View  Result Date: 08/02/2020 IMPRESSION: 1. No evidence of acute cardiopulmonary disease. 2. Approximately 6 mm nodular opacity in the left upper lung, likely the calcified nodule/granuloma seen on prior CT chest. Electronically Signed   By: Feliberto Harts MD   On: 08/02/2020 17:02   Cardiac Studies   LHC 08/02/2020:  The left ventricular systolic function is normal.  LV end diastolic pressure is mildly elevated.  The left ventricular ejection fraction is 55-65% by visual estimate.  Prox LAD to Mid LAD lesion is 30% stenosed.  1st Diag lesion is 60% stenosed.  Mid Cx to Dist Cx lesion is 30% stenosed.  Ramus lesion is 30% stenosed.   1.  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  Worst stenosis is 60% in ostial first diagonal. 2.  Normal LV systolic function mildly elevated left ventricular end-diastolic  pressure.  Recommendations: Suspect supply demand ischemia in the setting of uncontrolled hypertension. Recommend aggressive medical therapy for nonobstructive coronary artery disease, smoking cessation and blood pressure control. I added carvedilol and amlodipine for blood pressure control. I also added atorvastatin. __________  2D echo -Pending  Patient Profile     40 y.o. male with history of premature CAD on his father side of the family, HTN, COPD, ongoing tobacco use at 1 pack/day for 30 years, marijuana use, and history of significant alcohol abuse requiring prior rehab who is being seen today for the evaluation of elevated troponin at the request of Dr. Larinda Buttery.  Assessment & Plan    1. Elevated high sensitivity troponin: -Troponin has trended to 2498 currently, cycle until peak -Urgent LHC on the afternoon of 3/18 demonstrated mild to moderate nonobstructive CAD with elevated troponin suspected to be in the setting of supply demand ischemia secondary to uncontrolled hypertension  -Recommend aggressive medical therapy for nonobstructive CAD along with BP control and smoking cessation  -ASA, Lipitor, amlodipine, Coreg -Post cath instructions  -A1c pending for further risk stratification -Echo pending    2. Hypertensive urgency: -BP is improving from the 190s to 150s systolic -Not on antihypertensives PTA -Continue newly started amlodipine and Coreg with monitoring of BP, escalate as indicated  3. HLD: -LDL 144 this admission with goal < 70 -TG 167, reduce sugary foods and drinks  -Lipitor 80 mg -Follow up fasting lipid panel and LFT in ~ 8 weeks   4. COPD with ongoing tobacco use: -No active exacerbation  -Smoking cessation recommended -Nicotine patches  5. Alcohol use: -Prior significant use s/p rehab, now 2-3 beers daily -Complete cessation recommended    For questions or updates, please contact [facility] Please consult [email or web address] for contact info  under Cardiology/STEMI.    Signed, Eula Listen, PA-C [facility] Pager: [phone] 08/03/2020, 8:07 AM

## 2020-08-03 NOTE — Discharge Instructions (Signed)
Abstain from smoking and drinking alcohol

## 2020-08-03 NOTE — Progress Notes (Signed)
*  PRELIMINARY RESULTS* Echocardiogram 2D Echocardiogram has been performed.  Cherre Kothari S Talbot [facility] 08/03/2020, 10:13 AM 

## 2020-08-03 NOTE — Progress Notes (Addendum)
Pt BP at 175/103 HR 66 complaints 3/10 CP . Pt BP recheck after labetalol 10 mg PRN pt BP at 164/104 HR 69, no complaints of CP. Ouma NP made aware and no new order was place. Will continue to monitor.

## 2020-08-05 ENCOUNTER — Telehealth: Payer: Self-pay | Admitting: *Deleted

## 2020-08-05 ENCOUNTER — Encounter: Payer: Self-pay | Admitting: Cardiovascular Disease

## 2020-08-05 LAB — HEMOGLOBIN A1C
Hgb A1c MFr Bld: 5.5 % (ref 4.8–5.6)
Mean Plasma Glucose: 111.15 mg/dL

## 2020-08-05 NOTE — Telephone Encounter (Signed)
-----   Message from Sondra Barges, PA-C sent at 08/03/2020  2:12 PM EDT ----- Inpatient study, discharged prior to reading of echo. Please inform the patient his echo was normal. Schedule hospital follow up in 1-2 weeks.

## 2020-08-05 NOTE — Telephone Encounter (Signed)
Reviewed results and recommendations to follow up in 1-2 weeks. Advised that someone from our scheduling department will call to get that set up. He was appreciative for the call with no further questions at this time.

## 2020-08-08 ENCOUNTER — Emergency Department: Payer: Medicaid Other

## 2020-08-08 ENCOUNTER — Emergency Department
Admission: EM | Admit: 2020-08-08 | Discharge: 2020-08-09 | Disposition: A | Discharge status: Home / Self Care | Payer: Medicaid Other | Attending: Emergency Medicine | Admitting: Emergency Medicine

## 2020-08-08 ENCOUNTER — Other Ambulatory Visit: Payer: Self-pay

## 2020-08-08 DIAGNOSIS — J449 Chronic obstructive pulmonary disease, unspecified: Secondary | ICD-10-CM | POA: Diagnosis not present

## 2020-08-08 DIAGNOSIS — Z79899 Other long term (current) drug therapy: Secondary | ICD-10-CM | POA: Insufficient documentation

## 2020-08-08 DIAGNOSIS — Z7982 Long term (current) use of aspirin: Secondary | ICD-10-CM | POA: Insufficient documentation

## 2020-08-08 DIAGNOSIS — F17210 Nicotine dependence, cigarettes, uncomplicated: Secondary | ICD-10-CM | POA: Insufficient documentation

## 2020-08-08 DIAGNOSIS — R42 Dizziness and giddiness: Secondary | ICD-10-CM | POA: Diagnosis present

## 2020-08-08 DIAGNOSIS — I10 Essential (primary) hypertension: Secondary | ICD-10-CM | POA: Diagnosis not present

## 2020-08-08 LAB — CBC
HCT: 48.1 % (ref 39.0–52.0)
Hemoglobin: 16.2 g/dL (ref 13.0–17.0)
MCH: 29.2 pg (ref 26.0–34.0)
MCHC: 33.7 g/dL (ref 30.0–36.0)
MCV: 86.8 fL (ref 80.0–100.0)
Platelets: 296 10*3/uL (ref 150–400)
RBC: 5.54 MIL/uL (ref 4.22–5.81)
RDW: 12.8 % (ref 11.5–15.5)
WBC: 9.5 10*3/uL (ref 4.0–10.5)
nRBC: 0 % (ref 0.0–0.2)

## 2020-08-08 LAB — BASIC METABOLIC PANEL
Anion gap: 11 (ref 5–15)
BUN: 21 mg/dL — ABNORMAL HIGH (ref 6–20)
CO2: 20 mmol/L — ABNORMAL LOW (ref 22–32)
Calcium: 9.1 mg/dL (ref 8.9–10.3)
Chloride: 104 mmol/L (ref 98–111)
Creatinine, Ser: 1 mg/dL (ref 0.61–1.24)
GFR, Estimated: >60 mL/min (ref >60)
Glucose, Bld: 101 mg/dL — ABNORMAL HIGH (ref 70–99)
Potassium: 4 mmol/L (ref 3.5–5.1)
Sodium: 135 mmol/L (ref 135–145)

## 2020-08-08 LAB — TROPONIN I (HIGH SENSITIVITY): Troponin I (High Sensitivity): 117 ng/L (ref <18)

## 2020-08-08 LAB — CBG MONITORING, ED: Glucose-Capillary: 110 mg/dL — ABNORMAL HIGH (ref 70–99)

## 2020-08-08 LAB — BRAIN NATRIURETIC PEPTIDE: B Natriuretic Peptide: 28.1 pg/mL (ref 0.0–100.0)

## 2020-08-08 NOTE — ED Triage Notes (Signed)
Pt states coming in after having a heart attack last week. Pt states no stent was placed. Pt states he was at work and started to feel dizzy and like he was going to faint. Pt also states shortness of breath.  Pt states taking a nitroglycerin 2 hours ago.

## 2020-08-09 ENCOUNTER — Other Ambulatory Visit: Payer: Self-pay

## 2020-08-09 ENCOUNTER — Ambulatory Visit (INDEPENDENT_AMBULATORY_CARE_PROVIDER_SITE_OTHER): Payer: Medicaid Other | Admitting: Family

## 2020-08-09 ENCOUNTER — Encounter: Payer: Self-pay | Admitting: Family

## 2020-08-09 VITALS — BP 110/72 | HR 80 | Ht 71.0 in | Wt 245.0 lb

## 2020-08-09 DIAGNOSIS — Z720 Tobacco use: Secondary | ICD-10-CM | POA: Diagnosis not present

## 2020-08-09 DIAGNOSIS — E785 Hyperlipidemia, unspecified: Secondary | ICD-10-CM | POA: Diagnosis not present

## 2020-08-09 DIAGNOSIS — I10 Essential (primary) hypertension: Secondary | ICD-10-CM | POA: Diagnosis not present

## 2020-08-09 DIAGNOSIS — I25118 Atherosclerotic heart disease of native coronary artery with other forms of angina pectoris: Secondary | ICD-10-CM

## 2020-08-09 LAB — TROPONIN I (HIGH SENSITIVITY): Troponin I (High Sensitivity): 125 ng/L (ref <18)

## 2020-08-09 MED ORDER — NITROGLYCERIN 0.4 MG SL SUBL: 0.4000 mg | SUBLINGUAL_TABLET | SUBLINGUAL | 5 refills | Status: AC | PRN

## 2020-08-09 MED ORDER — CARVEDILOL 12.5 MG PO TABS: 12.5000 mg | ORAL_TABLET | Freq: Two times a day (BID) | ORAL | 2 refills | Status: AC

## 2020-08-09 MED ORDER — ATORVASTATIN CALCIUM 80 MG PO TABS: 80.0000 mg | ORAL_TABLET | Freq: Every day | ORAL | 2 refills | Status: AC

## 2020-08-09 MED ORDER — AMLODIPINE BESYLATE 10 MG PO TABS: 10.0000 mg | ORAL_TABLET | Freq: Every day | ORAL | 2 refills | Status: AC

## 2020-08-09 NOTE — Patient Instructions (Addendum)
Medication Instructions:  Continue your current medications.   *If you need a refill on your cardiac medications before your next appointment, please call your pharmacy*  Lab Work: None ordered today.   Testing/Procedures: Your cardiac catheterization showed nonobstructive coronary disease.   Your echocardiogram showed normal heart pumping function and normal heart valves.   Follow-Up: At [facility], you and your health needs are our priority.  As part of our continuing mission to provide you with exceptional heart care, we have created designated Provider Care Teams.  These Care Teams include your primary Cardiologist (physician) and Advanced Practice Providers (APPs -  Physician Assistants and Nurse Practitioners) who all work together to provide you with the care you need, when you need it.  We recommend signing up for the patient portal called "MyChart".  Sign up information is provided on this After Visit Summary.  MyChart is used to connect with patients for Virtual Visits (Telemedicine).  Patients are able to view lab/test results, encounter notes, upcoming appointments, etc.  Non-urgent messages can be sent to your provider as well.   To learn more about what you can do with MyChart, go to [email or web address].    Your next appointment:   6 week(s)  The format for your next appointment:   In Person  Provider:   You may see Lorine Bears, MD or one of the following Advanced Practice Providers on your designated Care Team:    Nicolasa Ducking, NP  Eula Listen, PA-C  Marisue Ivan, PA-C  Cadence [place], [place]  Gillian Shields, NP  Other Instructions  [facility] [employer] membership may be available. You can call patient financial assistance [phone] to see if there are additional resources available.   Heart Healthy Diet Recommendations: A low-salt diet is recommended. Meats should be grilled, baked, or boiled. Avoid fried foods. Focus on lean protein sources like  fish or chicken with vegetables and fruits. The American Heart Association is a Chief Technology Officer!  American Heart Association Diet and Lifeystyle Recommendations   Exercise recommendations: The American Heart Association recommends 150 minutes of moderate intensity exercise weekly. Try 30 minutes of moderate intensity exercise 4-5 times per week. This could include walking, jogging, or swimming.  DASH Eating Plan DASH stands for Dietary Approaches to Stop Hypertension. The DASH eating plan is a healthy eating plan that has been shown to:  Reduce high blood pressure (hypertension).  Reduce your risk for type 2 diabetes, heart disease, and stroke.  Help with weight loss. What are tips for following this plan? Reading food labels  Check food labels for the amount of salt (sodium) per serving. Choose foods with less than 5 percent of the Daily Value of sodium. Generally, foods with less than 300 milligrams (mg) of sodium per serving fit into this eating plan.  To find whole grains, look for the word "whole" as the first word in the ingredient list. Shopping  Buy products labeled as "low-sodium" or "no salt added."  Buy fresh foods. Avoid canned foods and pre-made or frozen meals. Cooking  Avoid adding salt when cooking. Use salt-free seasonings or herbs instead of table salt or sea salt. Check with your health care provider or pharmacist before using salt substitutes.  Do not fry foods. Cook foods using healthy methods such as baking, boiling, grilling, roasting, and broiling instead.  Cook with heart-healthy oils, such as olive, canola, avocado, soybean, or sunflower oil. Meal planning  Eat a balanced diet that includes: ? 4 or more servings of fruits and 4  or more servings of vegetables each day. Try to fill one-half of your plate with fruits and vegetables. ? 6-8 servings of whole grains each day. ? Less than 6 oz (170 g) of lean meat, poultry, or fish each day. A 3-oz (85-g)  serving of meat is about the same size as a deck of cards. One egg equals 1 oz (28 g). ? 2-3 servings of low-fat dairy each day. One serving is 1 cup (237 mL). ? 1 serving of nuts, seeds, or beans 5 times each week. ? 2-3 servings of heart-healthy fats. Healthy fats called omega-3 fatty acids are found in foods such as walnuts, flaxseeds, fortified milks, and eggs. These fats are also found in cold-water fish, such as sardines, salmon, and mackerel.  Limit how much you eat of: ? Canned or prepackaged foods. ? Food that is high in trans fat, such as some fried foods. ? Food that is high in saturated fat, such as fatty meat. ? Desserts and other sweets, sugary drinks, and other foods with added sugar. ? Full-fat dairy products.  Do not salt foods before eating.  Do not eat more than 4 egg yolks a week.  Try to eat at least 2 vegetarian meals a week.  Eat more home-cooked food and less restaurant, buffet, and fast food.   Lifestyle  When eating at a restaurant, ask that your food be prepared with less salt or no salt, if possible.  If you drink alcohol: ? Limit how much you use to:  0-1 drink a day for women who are not pregnant.  0-2 drinks a day for men. ? Be aware of how much alcohol is in your drink. In the [place] one drink equals one 12 oz bottle of beer (355 mL), one 5 oz glass of wine (148 mL), or one 1 oz glass of hard liquor (44 mL). General information  Avoid eating more than 2,300 mg of salt a day. If you have hypertension, you may need to reduce your sodium intake to 1,500 mg a day.  Work with your health care provider to maintain a healthy body weight or to lose weight. Ask what an ideal weight is for you.  Get at least 30 minutes of exercise that causes your heart to beat faster (aerobic exercise) most days of the week. Activities may include walking, swimming, or biking.  Work with your health care provider or dietitian to adjust your eating plan to your individual  calorie needs. What foods should I eat? Fruits All fresh, dried, or frozen fruit. Canned fruit in natural juice (without added sugar). Vegetables Fresh or frozen vegetables (raw, steamed, roasted, or grilled). Low-sodium or reduced-sodium tomato and vegetable juice. Low-sodium or reduced-sodium tomato sauce and tomato paste. Low-sodium or reduced-sodium canned vegetables. Grains Whole-grain or whole-wheat bread. Whole-grain or whole-wheat pasta. Brown rice. Orpah Cobb. Bulgur. Whole-grain and low-sodium cereals. Pita bread. Low-fat, low-sodium crackers. Whole-wheat flour tortillas. Meats and other proteins Skinless chicken or [place]. Ground chicken or [place]. Pork with fat trimmed off. Fish and seafood. Egg whites. Dried beans, peas, or lentils. Unsalted nuts, nut butters, and seeds. Unsalted canned beans. Lean cuts of beef with fat trimmed off. Low-sodium, lean precooked or cured meat, such as sausages or meat loaves. Dairy Low-fat (1%) or fat-free (skim) milk. Reduced-fat, low-fat, or fat-free cheeses. Nonfat, low-sodium ricotta or cottage cheese. Low-fat or nonfat yogurt. Low-fat, low-sodium cheese. Fats and oils Soft margarine without trans fats. Vegetable oil. Reduced-fat, low-fat, or light mayonnaise and salad dressings (  reduced-sodium). Canola, safflower, olive, avocado, soybean, and sunflower oils. Avocado. Seasonings and condiments Herbs. Spices. Seasoning mixes without salt. Other foods Unsalted popcorn and pretzels. Fat-free sweets. The items listed above may not be a complete list of foods and beverages you can eat. Contact a dietitian for more information. What foods should I avoid? Fruits Canned fruit in a light or heavy syrup. Fried fruit. Fruit in cream or butter sauce. Vegetables Creamed or fried vegetables. Vegetables in a cheese sauce. Regular canned vegetables (not low-sodium or reduced-sodium). Regular canned tomato sauce and paste (not low-sodium or reduced-sodium).  Regular tomato and vegetable juice (not low-sodium or reduced-sodium). Rosita Fire. Olives. Grains Baked goods made with fat, such as croissants, muffins, or some breads. Dry pasta or rice meal packs. Meats and other proteins Fatty cuts of meat. Ribs. Fried meat. Tomasa Blase. Bologna, salami, and other precooked or cured meats, such as sausages or meat loaves. Fat from the back of a pig (fatback). Bratwurst. Salted nuts and seeds. Canned beans with added salt. Canned or smoked fish. Whole eggs or egg yolks. Chicken or [place] with skin. Dairy Whole or 2% milk, cream, and half-and-half. Whole or full-fat cream cheese. Whole-fat or sweetened yogurt. Full-fat cheese. Nondairy creamers. Whipped toppings. Processed cheese and cheese spreads. Fats and oils Butter. Stick margarine. Lard. Shortening. Ghee. Bacon fat. Tropical oils, such as coconut, palm kernel, or palm oil. Seasonings and condiments Onion salt, garlic salt, seasoned salt, table salt, and sea salt. Worcestershire sauce. Tartar sauce. Barbecue sauce. Teriyaki sauce. Soy sauce, including reduced-sodium. Steak sauce. Canned and packaged gravies. Fish sauce. Oyster sauce. Cocktail sauce. Store-bought horseradish. Ketchup. Mustard. Meat flavorings and tenderizers. Bouillon cubes. Hot sauces. Pre-made or packaged marinades. Pre-made or packaged taco seasonings. Relishes. Regular salad dressings. Other foods Salted popcorn and pretzels. The items listed above may not be a complete list of foods and beverages you should avoid. Contact a dietitian for more information. Where to find more information  National Heart, Lung, and Blood Institute: PopSteam.is  American Heart Association: www.heart.org  Academy of Nutrition and Dietetics: www.eatright.org  National Kidney Foundation: www.kidney.org Summary  The DASH eating plan is a healthy eating plan that has been shown to reduce high blood pressure (hypertension). It may also reduce your risk for  type 2 diabetes, heart disease, and stroke.  When on the DASH eating plan, aim to eat more fresh fruits and vegetables, whole grains, lean proteins, low-fat dairy, and heart-healthy fats.  With the DASH eating plan, you should limit salt (sodium) intake to 2,300 mg a day. If you have hypertension, you may need to reduce your sodium intake to 1,500 mg a day.  Work with your health care provider or dietitian to adjust your eating plan to your individual calorie needs. This information is not intended to replace advice given to you by your health care provider. Make sure you discuss any questions you have with your health care provider. Document Revised: 04/07/2019 Document Reviewed: 04/07/2019 Elsevier Patient Education  2021 [employer].

## 2020-08-09 NOTE — Progress Notes (Signed)
Office Visit    Patient Name: Alejandro Gibson Date of Encounter: 08/09/2020  PCP:  [facility]   [facility]  Cardiologist:  Lorine Bears, MD  Advanced Practice Provider:  No care team member to display Electrophysiologist:  None   Chief Complaint    Alejandro Gibson is a 40 y.o. male with a hx of nonobstructive coronary artery disease, hypertension, COPD, hyperlipidemia, tobacco and alcohol use  presents today for hospital follow up.    Past Medical History    Past Medical History:  Diagnosis Date  . COPD (chronic obstructive pulmonary disease) (HCC)   . Essential hypertension    Past Surgical History:  Procedure Laterality Date  . HAND SURGERY     ORIF right ring finger  . KNEE SURGERY     arthroscopy right knee  . LEFT HEART CATH AND CORONARY ANGIOGRAPHY N/A 08/02/2020   Procedure: LEFT HEART CATH AND CORONARY ANGIOGRAPHY;  Surgeon: Iran Ouch, MD;  Location: [facility] INVASIVE CV LAB;  Service: Cardiovascular;  Laterality: N/A;   Allergies  Allergies  Allergen Reactions  . Penicillins Itching  . Vicodin [Hydrocodone-Acetaminophen] Itching    "makes me itch"   History of Present Illness    Alejandro Gibson is a 40 y.o. male with a hx of nonobstructive coronary artery disease, hypertension, COPD, hyperlipidemia, tobacco and alcohol use last seen while hospitalized.  He was hospitalized 08/02/2020 for NSTEMI in the setting of uncontrolled hypertension.  LHC 08/02/2020 with mild to moderate nonobstructive coronary artery disease with the worst stenosis 60% ostial first diagonal agonal, normal LVEF, elevated LVEDP.  Suspected supply demand ischemia in setting of uncontrolled hypertension.  He was recommended for medical therapy for nonobstructive coronary disease, smoking cessation, blood pressure control.  Echo 08/03/2020 LVEF 50 to 60%, no R WMA, normal diastolic murmurs, RV normal size and function, no significant  valvular abnormalities.  He was seen in the ED yesterday 08/08/2020 due to dizziness.  His high-sensitivity troponins were elevated at 117 and 125. However, this was significantly improved from previous admission 08/02/20 371 and 2,498.  He presents today for follow-up with his wife. We reviewed his cardiac testing in detail. Reports no chest pain, pressure, or tightness. No edema, orthopnea, PND. Reports no palpitations. 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 Had not had much to eat or drink that day. Has not been checking his BP at home as he does not have BP cuff at home and tells me finances are difficult in this season. He has been working to improve his diet. No formal exercise routine but considering walking regimen.   EKGs/Labs/Other Studies Reviewed:   The following studies were reviewed today:  Echo 08/02/20  1. Left ventricular ejection fraction, by estimation, is 55 to 60%. The  left ventricle has normal function. The left ventricle has no regional  wall motion abnormalities. Left ventricular diastolic parameters were  normal.   2. Right ventricular systolic function is normal. The right ventricular  size is normal.   3. The mitral valve is normal in structure. No evidence of mitral valve  regurgitation.   4. The aortic valve was not well visualized. Aortic valve regurgitation  is not visualized.   5. The inferior vena cava is normal in size with greater than 50%  respiratory variability, suggesting right atrial pressure of 3 mmHg.   LHC3/19/22  The left ventricular systolic function is normal.  LV end diastolic pressure is mildly elevated.  The left ventricular ejection fraction is 55-65% by visual estimate.  Prox LAD to Mid LAD lesion is 30% stenosed.  1st Diag lesion is 60% stenosed.  Mid Cx to Dist Cx lesion is 30% stenosed.  Ramus lesion is 30% stenosed.   1.  Mild to moderate nonobstructive coronary artery  disease.  Worst stenosis is 60% in ostial first diagonal. 2.  Normal LV systolic function mildly elevated left ventricular end-diastolic pressure.   Recommendations: Suspect supply demand ischemia in the setting of uncontrolled hypertension. Recommend aggressive medical therapy for nonobstructive coronary artery disease, smoking cessation and blood pressure control. I added carvedilol and amlodipine for blood pressure control. I also added atorvastatin.   EKG:  No EKG today.   Recent Labs: 08/02/2020: Magnesium 2.2 08/03/2020: ALT 30 08/08/2020: B Natriuretic Peptide 28.1; BUN 21; Creatinine, Ser 1.00; Hemoglobin 16.2; Platelets 296; Potassium 4.0; Sodium 135  Recent Lipid Panel    Component Value Date/Time   CHOL 209 (H) 08/03/2020 0453   TRIG 167 (H) 08/03/2020 0453   HDL 32 (L) 08/03/2020 0453   CHOLHDL 6.5 08/03/2020 0453   VLDL 33 08/03/2020 0453   LDLCALC 144 (H) 08/03/2020 0453    Home Medications   Current Meds  Medication Sig  . amLODipine (NORVASC) 10 MG tablet Take 1 tablet (10 mg total) by mouth daily.  Marland Kitchen aspirin EC 81 MG EC tablet Take 1 tablet (81 mg total) by mouth daily. Swallow whole.  Marland Kitchen atorvastatin (LIPITOR) 80 MG tablet Take 1 tablet (80 mg total) by mouth at bedtime.  . carvedilol (COREG) 12.5 MG tablet Take 1 tablet (12.5 mg total) by mouth 2 (two) times daily with a meal.  . folic acid (FOLVITE) 1 MG tablet Take 1 tablet (1 mg total) by mouth daily.  . methocarbamol (ROBAXIN) 500 MG tablet 1-2 tablets qid prn muscle spasms.  . Multiple Vitamin (MULTIVITAMIN WITH MINERALS) TABS tablet Take 1 tablet by mouth daily.  . nicotine (NICODERM CQ - DOSED IN MG/24 HOURS) 14 mg/24hr patch Place 1 patch (14 mg total) onto the skin daily.  . nitroGLYCERIN (NITROSTAT) 0.4 MG SL tablet Place 1 tablet (0.4 mg total) under the tongue every 5 (five) minutes as needed for chest pain.    Review of Systems  All other systems reviewed and are otherwise negative except as noted  above.  Physical Exam    VS:  BP 110/72   Pulse 80   Ht 5\' 11"  (1.803 m)   Wt 245 lb (111.1 kg)   BMI 34.17 kg/m  , BMI Body mass index is 34.17 kg/m.  Wt Readings from Last 3 Encounters:  08/09/20 245 lb (111.1 kg)  08/03/20 251 lb 9.6 oz (114.1 kg)  09/03/17 232 lb (105.2 kg)    GEN: Well nourished, well developed, in no acute distress. HEENT: normal. Neck: Supple, no JVD, carotid bruits, or masses. Cardiac: RRR, no murmurs, rubs, or gallops. No clubbing, cyanosis, edema.  Radials/DP/PT 2+ and equal bilaterally.  Respiratory:  Respirations regular and unlabored, clear to auscultation bilaterally. GI: Soft, nontender, nondistended. MS: No deformity or atrophy. Skin: Warm and dry, no rash. R radial cath site healing appropriately with no hematoma, ecchymosis, nor signs of infection.  Neuro:  Strength and sensation are intact. Psych: Normal affect.  Assessment & Plan    1. Hypertension- BP well controlled. Continue current antihypertensive regimen. Refills of Amlodipine 10mg  daily, Coreg 12.5mg  twice daily were provided. No BP cuff at  home and financial constraint at this time, will inquire with SW team if we can provide.  2. Nonobstructive coronary disease- Cath site healing appropriately. Encouraged heart healthy diet and regular cardiovascular exercise. GDMT includes aspirin, statin, beta blocker, PRN nitroglycerin.   3. Hyperlipidemia, LDL goal less than 70- 08/03/20 LDL 144. Started on Atorvastatin 80mg  during recent hospitalization. Plan on lipid panel and liver function at follow up.  4. Tobacco / alcohol use - Smoking cessation encouraged. Has cut back since hospital discharge. Recommend utilization of [id number]. Recommend complete avoidance of alcohol.  Disposition: Follow up in 6 week(s) with Dr. or APP  Signed, Kirke Corin, NP 08/09/2020, 9:01 AM [facility]

## 2020-08-09 NOTE — ED Notes (Signed)
Patient denies pain and is resting comfortably.  

## 2020-08-09 NOTE — ED Notes (Signed)
Pt A&Ox4, denies dizziness or chest pain at this time. Pt agreeable with discharge home and follow up with cardiology first thing in the morning. Pt ambulatory out of ED without difficulty.

## 2020-08-09 NOTE — ED Provider Notes (Signed)
[facility] Emergency Department Provider Note   ____________________________________________   Event Date/Time   First MD Initiated Contact with Patient 08/08/20 2259     (approximate)  I have reviewed the triage vital signs and the nursing notes.   HISTORY  Chief Complaint Dizziness    HPI Alejandro RADERMACHER is a 40 y.o. male with a stated past medical history of COPD, hypertension, and an NSTEMI 1 week ago who presents for dizziness that occurred approximately 2 hours prior to arrival.  Patient describes lightheadedness as if he was going to pass out.  Patient also took nitroglycerin after the symptoms came on and states that this did improve the symptoms.  Patient denies any complaints at this time and states that he is pain-free.  Patient currently denies any vision changes, tinnitus, difficulty speaking, facial droop, sore throat, shortness of breath, abdominal pain, nausea/vomiting/diarrhea, dysuria, or weakness/numbness/paresthesias in any extremity         Past Medical History:  Diagnosis Date  . COPD (chronic obstructive pulmonary disease) (HCC)   . Essential hypertension     Patient Active Problem List   Diagnosis Date Noted  . Hyperlipidemia   . NSTEMI (non-ST elevated myocardial infarction) (HCC) 08/02/2020  . Hypertensive urgency 08/02/2020  . Tobacco use 08/02/2020  . Unstable angina (HCC) 08/02/2020    Past Surgical History:  Procedure Laterality Date  . HAND SURGERY     ORIF right ring finger  . KNEE SURGERY     arthroscopy right knee  . LEFT HEART CATH AND CORONARY ANGIOGRAPHY N/A 08/02/2020   Procedure: LEFT HEART CATH AND CORONARY ANGIOGRAPHY;  Surgeon: Iran Ouch, MD;  Location: [facility] INVASIVE CV LAB;  Service: Cardiovascular;  Laterality: N/A;    Prior to Admission medications   Medication Sig Start Date End Date Taking? Authorizing Provider  amLODipine (NORVASC) 10 MG tablet Take 1 tablet (10 mg total) by mouth  daily. 08/04/20  Yes Enedina Finner, MD  aspirin EC 81 MG EC tablet Take 1 tablet (81 mg total) by mouth daily. Swallow whole. 08/04/20  Yes Enedina Finner, MD  atorvastatin (LIPITOR) 80 MG tablet Take 1 tablet (80 mg total) by mouth at bedtime. 08/03/20  Yes Enedina Finner, MD  carvedilol (COREG) 12.5 MG tablet Take 1 tablet (12.5 mg total) by mouth 2 (two) times daily with a meal. 08/03/20  Yes Enedina Finner, MD  folic acid (FOLVITE) 1 MG tablet Take 1 tablet (1 mg total) by mouth daily. 08/04/20  Yes Enedina Finner, MD  methocarbamol (ROBAXIN) 500 MG tablet 1-2 tablets qid prn muscle spasms. 09/03/17  Yes Tommi Rumps, PA-C  Multiple Vitamin (MULTIVITAMIN WITH MINERALS) TABS tablet Take 1 tablet by mouth daily. 08/04/20  Yes Enedina Finner, MD  nicotine (NICODERM CQ - DOSED IN MG/24 HOURS) 14 mg/24hr patch Place 1 patch (14 mg total) onto the skin daily. 08/04/20  Yes Enedina Finner, MD  nitroGLYCERIN (NITROSTAT) 0.4 MG SL tablet Place 1 tablet (0.4 mg total) under the tongue every 5 (five) minutes as needed for chest pain. 08/03/20  Yes Enedina Finner, MD    Allergies Penicillins and Vicodin [hydrocodone-acetaminophen]  Family History  Problem Relation Age of Onset  . Colon cancer Mother   . CAD Father        a. 3s to 6s  . CAD Paternal Grandfather     Social History Social History   Tobacco Use  . Smoking status: Current Every Day Smoker    Packs/day: 1.00  Types: Cigarettes  . Smokeless tobacco: Never Used  Substance Use Topics  . Alcohol use: Yes    Alcohol/week: 1.0 standard drink    Types: 1 Cans of beer per week  . Drug use: Yes    Types: Marijuana    Review of Systems Constitutional: No fever/chills Eyes: No visual changes. ENT: No sore throat. Cardiovascular: Denies chest pain. Respiratory: Denies shortness of breath. Gastrointestinal: No abdominal pain.  No nausea, no vomiting.  No diarrhea. Genitourinary: Negative for dysuria. Musculoskeletal: Negative for acute  arthralgias Skin: Negative for rash. Neurological: Negative for headaches, weakness/numbness/paresthesias in any extremity Psychiatric: Negative for suicidal ideation/homicidal ideation   ____________________________________________   PHYSICAL EXAM:  VITAL SIGNS: ED Triage Vitals [08/08/20 2233]  Enc Vitals Group     BP (!) 171/111     Pulse Rate 93     Resp 20     Temp 99.1 F (37.3 C)     Temp src      SpO2 98 %     Weight      Height      Head Circumference      Peak Flow      Pain Score 0     Pain Loc      Pain Edu?      Excl. in GC?    Constitutional: Alert and oriented. Well appearing and in no acute distress. Eyes: Conjunctivae are normal. PERRL. Head: Atraumatic. Nose: No congestion/rhinnorhea. Mouth/Throat: Mucous membranes are moist. Neck: No stridor Cardiovascular: Grossly normal heart sounds.  Good peripheral circulation. Respiratory: Normal respiratory effort.  No retractions. Gastrointestinal: Soft and nontender. No distention. Musculoskeletal: No obvious deformities Neurologic:  Normal speech and language. No gross focal neurologic deficits are appreciated. Skin:  Skin is warm and dry. No rash noted. Psychiatric: Mood and affect are normal. Speech and behavior are normal.  ____________________________________________   LABS (all labs ordered are listed, but only abnormal results are displayed)  Labs Reviewed  BASIC METABOLIC PANEL - Abnormal; Notable for the following components:      Result Value   CO2 20 (*)    Glucose, Bld 101 (*)    BUN 21 (*)    All other components within normal limits  CBG MONITORING, ED - Abnormal; Notable for the following components:   Glucose-Capillary 110 (*)    All other components within normal limits  TROPONIN I (HIGH SENSITIVITY) - Abnormal; Notable for the following components:   Troponin I (High Sensitivity) 117 (*)    All other components within normal limits  TROPONIN I (HIGH SENSITIVITY) - Abnormal;  Notable for the following components:   Troponin I (High Sensitivity) 125 (*)    All other components within normal limits  CBC  BRAIN NATRIURETIC PEPTIDE  URINALYSIS, COMPLETE (UACMP) WITH MICROSCOPIC   ____________________________________________  EKG  ED ECG REPORT I, Merwyn Katos, the attending physician, personally viewed and interpreted this ECG.  Date: 08/08/2020 EKG Time: 2234 Rate: 90 Rhythm: normal sinus rhythm QRS Axis: normal Intervals: normal ST/T Wave abnormalities: normal Narrative Interpretation: no evidence of acute ischemia  ____________________________________________  RADIOLOGY  ED MD interpretation: 2 view chest x-ray shows no evidence of acute abnormalities including no pneumonia, pneumothorax, or widened mediastinum  Official radiology report(s): DG Chest 2 View  Result Date: 08/08/2020 CLINICAL DATA:  Chest pain EXAM: CHEST - 2 VIEW COMPARISON:  08/02/2020 FINDINGS: Calcified granuloma in the left upper lobe. Heart is normal size. No confluent opacities or effusions. No acute bony abnormality. IMPRESSION:  No active cardiopulmonary disease. Electronically Signed   By: Charlett Nose M.D.   On: 08/08/2020 23:01    ____________________________________________   PROCEDURES  Procedure(s) performed (including Critical Care):  .1-3 Lead EKG Interpretation Performed by: Merwyn Katos, MD Authorized by: Merwyn Katos, MD     Interpretation: normal     ECG rate:  84   ECG rate assessment: normal     Rhythm: sinus rhythm     Ectopy: none     Conduction: normal       ____________________________________________   INITIAL IMPRESSION / ASSESSMENT AND PLAN / ED COURSE  As part of my medical decision making, I reviewed the following data within the electronic MEDICAL RECORD NUMBER Nursing notes reviewed and incorporated, Labs reviewed, EKG interpreted, Old chart reviewed, Radiograph reviewed and Notes from prior ED visits reviewed and  incorporated        Patient presents with complaints of presyncope ED Workup:  CBC, BMP, Troponin, BNP, ECG, CXR Differential diagnosis includes HF, ICH, seizure, stroke, HOCM, ACS, aortic dissection, malignant arrhythmia, or GI bleed. Findings: No evidence of acute laboratory abnormalities.  Troponin elevated but much improved from 1 week ago EKG: No e/o STEMI. No evidence of Brugadas sign, delta wave, epsilon wave, significantly prolonged QTc, or malignant arrhythmia.  I spoke to patient at length about his options including offering admission given the slightly elevated troponin.  However, patient states that he has a follow-up appointment with Dr. Kirke Corin in approximately 6 hours and feels comfortable being discharged at this time to make that follow-up appointment.  Patient is hemodynamically stable and is chest pain-free at this time and therefore is stable for discharge  Disposition: Discharge. Patient is at baseline at this time. Return precautions expressed and understood in person. Advised follow up with primary care provider or clinic physician in next 24 hours.      ____________________________________________   FINAL CLINICAL IMPRESSION(S) / ED DIAGNOSES  Final diagnoses:  Lightheadedness  Primary hypertension     ED Discharge Orders    None       Note:  This document was prepared using Dragon voice recognition software and may include unintentional dictation errors.   Merwyn Katos, MD 08/09/20 [phone]

## 2020-09-19 NOTE — Progress Notes (Signed)
Cardiology Office Note:    Date:  09/20/2020   ID:  Alejandro Gibson, Alejandro Gibson 07-22-1980, MRN [id number]  PCP:  Center, [facility] Cardiologist:  Lorine Bears, MD  [facility] Electrophysiologist:  None   Referring MD: Center, [facility]*   Chief Complaint: 6 week follow-up  History of Present Illness:    Alejandro Gibson is a 40 y.o. male with a hx of nonobstructive CAD, HTN, COPD, HLD, tobacco and alcohol use who present for 6 week follow-up.   Patient was hospialized 07/2020 for NSTEMI in the setting of uncontrolled HTN. LHC showed mild to moderate nonobstructive CAD with the worst stenosis 60% ostial first diagonal, normal LVEF, elevated LVEDP. Suspected demand ischemia in the setting of uncontrolled HTN. Recommended for medical therapy. Eho showed LVEF 50-60%, no WMA, normal diastolic function.   HE was seen in the ED 08/08/20 for dizziness. HS trop was elevated however was improved from recent admission.   He was seen in hospital follow-up 08/09/20 and still had some dizziness, but had not taken Bps at home. BP was well controlled and all medicines were continued. Had cut back on tobacco use.   Today, the patient reports she has not had any more dizzy spells. He had some burning in his chest. Episode occurred 6 days ago. He put a nicotine patch on his shoulder and had some left sided shoulder pain in the joint. Then he had a chest burning sensation. It was on the left side and similar to heart attack. Lasted 4 minutes. He did not take a Nitro. Has not had recurrent episode. HE knew it was not GERD. Has not completely quit smoking, 2 cigs a day, still trying to cut back. Drinking more water, only decaf coffee. 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 Saw PCP and plans to see nutritionist and change diet. Also going to orthopedic for knee pain. Question about anxiety and anxiety meds, feels since he is cutting back on tobacco use he is having more  anxiety. He is back at work, so he is walking a lot. Will start at the gym soon. Does not have BP cuff at home. At MDs office he said it was 123/80 and then 117/82. For chest pain discussed initiaion of Imdur but patient wants to defer at this time.    Past Medical History:  Diagnosis Date  . Alcohol use   . COPD (chronic obstructive pulmonary disease) (HCC)   . Coronary artery disease    (a) echo 07/2020 prox-mid LAD 30% stenosed, 1st diag lesion 60%, mid-dist Cx 30%, ramus lesion 30%, EF 55-65%  . Essential hypertension   . Hyperlipidemia   . Tobacco use     Past Surgical History:  Procedure Laterality Date  . HAND SURGERY     ORIF right ring finger  . KNEE SURGERY     arthroscopy right knee  . LEFT HEART CATH AND CORONARY ANGIOGRAPHY N/A 08/02/2020   Procedure: LEFT HEART CATH AND CORONARY ANGIOGRAPHY;  Surgeon: Iran Ouch, MD;  Location: [facility] INVASIVE CV LAB;  Service: Cardiovascular;  Laterality: N/A;    Current Medications: No outpatient medications have been marked as taking for the 09/20/20 encounter (Appointment) with Alejandro Gibson, Alejandro Gibson, Alejandro Gibson.     Allergies:   Penicillins and Vicodin [hydrocodone-acetaminophen]   Social History   Socioeconomic History  . Marital status: Married    Spouse name: Not on file  . Number of children: Not on file  . Years of  education: Not on file  . Highest education level: Not on file  Occupational History  . Not on file  Tobacco Use  . Smoking status: Former Smoker    Packs/day: 1.00    Types: Cigarettes    Quit date: 07/31/2020    Years since quitting: 0.1  . Smokeless tobacco: Never Used  Substance and Sexual Activity  . Alcohol use: Yes    Alcohol/week: 1.0 standard drink    Types: 1 Cans of beer per week  . Drug use: Yes    Types: Marijuana  . Sexual activity: Not on file  Other Topics Concern  . Not on file  Social History Narrative  . Not on file   Social Determinants of Health   Financial Resource Strain: Not  on file  Food Insecurity: Not on file  Transportation Needs: Not on file  Physical Activity: Not on file  Stress: Not on file  Social Connections: Not on file     Family History: The patient's *family history includes CAD in his father and paternal grandfather; Colon cancer in his mother.  ROS:   Please see the history of present illness.    All other systems reviewed and are negative.  EKGs/Labs/Other Studies Reviewed:    The following studies were reviewed today:  Echo 08/03/20 1. Left ventricular ejection fraction, by estimation, is 55 to 60%. The  left ventricle has normal function. The left ventricle has no regional  wall motion abnormalities. Left ventricular diastolic parameters were  normal.  2. Right ventricular systolic function is normal. The right ventricular  size is normal.  3. The mitral valve is normal in structure. No evidence of mitral valve  regurgitation.  4. The aortic valve was not well visualized. Aortic valve regurgitation  is not visualized.  5. The inferior vena cava is normal in size with greater than 50%  respiratory variability, suggesting right atrial pressure of 3 mmHg.   Cardiac cath 08/02/20   The left ventricular systolic function is normal.  LV end diastolic pressure is mildly elevated.  The left ventricular ejection fraction is 55-65% by visual estimate.  Prox LAD to Mid LAD lesion is 30% stenosed.  1st Diag lesion is 60% stenosed.  Mid Cx to Dist Cx lesion is 30% stenosed.  Ramus lesion is 30% stenosed.   1.  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  Worst stenosis is 60% in ostial first diagonal. 2.  Normal LV systolic function mildly elevated left ventricular end-diastolic pressure.  Recommendations: Suspect supply demand ischemia in the setting of uncontrolled hypertension. Recommend aggressive medical therapy for nonobstructive coronary artery disease, smoking cessation and blood pressure control. I  added carvedilol and amlodipine for blood pressure control. I also added atorvastatin.   EKG:  EKG is  ordered today.  The ekg ordered today demonstrates NSR, 61 bpm, hyper acute t waves, TWI aVL, minimal J point elevation II, V5 and V6  Recent Labs: 08/02/2020: Magnesium 2.2 08/03/2020: ALT 30 08/08/2020: B Natriuretic Peptide 28.1; BUN 21; Creatinine, Ser 1.00; Hemoglobin 16.2; Platelets 296; Potassium 4.0; Sodium 135  Recent Lipid Panel    Component Value Date/Time   CHOL 209 (Gibson) 08/03/2020 0453   TRIG 167 (Gibson) 08/03/2020 0453   HDL 32 (L) 08/03/2020 0453   CHOLHDL 6.5 08/03/2020 0453   VLDL 33 08/03/2020 0453   LDLCALC 144 (Gibson) 08/03/2020 0453    Physical Exam:    VS:  There were no vitals taken for this visit.    Wt  Readings from Last 3 Encounters:  08/09/20 245 lb (111.1 kg)  08/03/20 251 lb 9.6 oz (114.1 kg)  09/03/17 232 lb (105.2 kg)     GEN:  Well nourished, well developed in no acute distress HEENT: Normal NECK: No JVD; No carotid bruits LYMPHATICS: No lymphadenopathy CARDIAC: RRR, no murmurs, rubs, gallops RESPIRATORY:  Minimal wheezing, diffusely diminished breath sounds ABDOMEN: Soft, non-tender, non-distended MUSCULOSKELETAL:  No edema; No deformity  SKIN: Warm and dry NEUROLOGIC:  Alert and oriented x 3 PSYCHIATRIC:  Normal affect   ASSESSMENT:    1. Coronary artery disease of native artery of native heart with stable angina pectoris (HCC)   2. Essential hypertension   3. Tobacco use   4. Hyperlipidemia LDL goal <70    PLAN:    In order of problems listed above:  HTN BP wnl today. Patient still does not have BP cuff at home, but it has been good at the last 2 PCP visits. No further dizzy spells. Continue amlodipine 10mg  daily, coreg 12.5mg  BID.   Nonobstructive CAD with stable angina Patient had one chest pain episode similar to MI. He did not take Nitro. He has not had recurrent episodes. Initiation on Imdur was discussed, but patient wants to  defer at this time. He is making many positive lifestyle changes with diet, smoking, exercise, and wants to continue with this first before new medications. Instructed to call if CP episodes continued or worsened. Recent cath/work-up overall very reassuring. Lifestyle changes encouraged. Continue Aspirin, statin, BB. EKG with some hyperacute T waves which is changed, I will check a BMET.  HLD  Fasting lipid panel and LFTs today. Continue atorvastatin. Continue lifestyle changes  Tobacco use Down to 2 cigarettes daily. Trying to quit, using nicotine patch.   Disposition: Follow up in 3 month(s) with MD/APP   Signed, [place] , Alejandro Gibson  09/20/2020 7:36 AM    [facility]

## 2020-09-20 ENCOUNTER — Encounter: Payer: Self-pay | Admitting: Medical

## 2020-09-20 ENCOUNTER — Other Ambulatory Visit: Payer: Self-pay

## 2020-09-20 ENCOUNTER — Ambulatory Visit (INDEPENDENT_AMBULATORY_CARE_PROVIDER_SITE_OTHER): Payer: Medicaid Other | Admitting: Medical

## 2020-09-20 VITALS — BP 110/70 | HR 61 | Ht 71.0 in | Wt 247.5 lb

## 2020-09-20 DIAGNOSIS — I25118 Atherosclerotic heart disease of native coronary artery with other forms of angina pectoris: Secondary | ICD-10-CM | POA: Diagnosis not present

## 2020-09-20 DIAGNOSIS — Z720 Tobacco use: Secondary | ICD-10-CM | POA: Diagnosis not present

## 2020-09-20 DIAGNOSIS — E785 Hyperlipidemia, unspecified: Secondary | ICD-10-CM | POA: Diagnosis not present

## 2020-09-20 DIAGNOSIS — I10 Essential (primary) hypertension: Secondary | ICD-10-CM

## 2020-09-20 NOTE — Patient Instructions (Addendum)
lMedication Instructions:  No changes at this time.   *If you need a refill on your cardiac medications before your next appointment, please call your pharmacy*   Lab Work: Lipid & Liver panel today.  If you have labs (blood work) drawn today and your tests are completely normal, you will receive your results only by: Marland Kitchen MyChart Message (if you have MyChart) OR . A paper copy in the mail If you have any lab test that is abnormal or we need to change your treatment, we will call you to review the results.   Testing/Procedures: None   Follow-Up: At [facility], you and your health needs are our priority.  As part of our continuing mission to provide you with exceptional heart care, we have created designated Provider Care Teams.  These Care Teams include your primary Cardiologist (physician) and Advanced Practice Providers (APPs -  Physician Assistants and Nurse Practitioners) who all work together to provide you with the care you need, when you need it.  We recommend signing up for the patient portal called "MyChart".  Sign up information is provided on this After Visit Summary.  MyChart is used to connect with patients for Virtual Visits (Telemedicine).  Patients are able to view lab/test results, encounter notes, upcoming appointments, etc.  Non-urgent messages can be sent to your provider as well.   To learn more about what you can do with MyChart, go to [email or web address].    Your next appointment:   3 month(s)  The format for your next appointment:   In Person  Provider:   You may see Lorine Bears, MD or one of the following Advanced Practice Providers on your designated Care Team:    Nicolasa Ducking, NP  Eula Listen, PA-C  Marisue Ivan, PA-C  Cadence Gilberton, [place]  Gillian Shields, NP

## 2020-09-21 LAB — BASIC METABOLIC PANEL
BUN/Creatinine Ratio: 8 — ABNORMAL LOW (ref 9–20)
BUN: 9 mg/dL (ref 6–24)
CO2: 24 mmol/L (ref 20–29)
Calcium: 9.4 mg/dL (ref 8.7–10.2)
Chloride: 101 mmol/L (ref 96–106)
Creatinine, Ser: 1.07 mg/dL (ref 0.76–1.27)
Glucose: 96 mg/dL (ref 65–99)
Potassium: 4.6 mmol/L (ref 3.5–5.2)
Sodium: 141 mmol/L (ref 134–144)
eGFR: 89 mL/min/{1.73_m2} (ref >59)

## 2020-09-21 LAB — HEPATIC FUNCTION PANEL
ALT: 39 IU/L (ref 0–44)
AST: 20 IU/L (ref 0–40)
Albumin: 4.6 g/dL (ref 4.0–5.0)
Alkaline Phosphatase: 130 IU/L — ABNORMAL HIGH (ref 44–121)
Bilirubin Total: 0.3 mg/dL (ref 0.0–1.2)
Bilirubin, Direct: 0.11 mg/dL (ref 0.00–0.40)
Total Protein: 7.1 g/dL (ref 6.0–8.5)

## 2020-09-21 LAB — LIPID PANEL
Chol/HDL Ratio: 3.4 ratio (ref 0.0–5.0)
Cholesterol, Total: 105 mg/dL (ref 100–199)
HDL: 31 mg/dL — ABNORMAL LOW (ref >39)
LDL Chol Calc (NIH): 59 mg/dL (ref 0–99)
Triglycerides: 71 mg/dL (ref 0–149)
VLDL Cholesterol Cal: 15 mg/dL (ref 5–40)

## 2020-09-24 ENCOUNTER — Telehealth: Payer: Self-pay | Admitting: *Deleted

## 2020-09-24 NOTE — Telephone Encounter (Signed)
Attempted to call pt to review results below. No answer. Lmtcb.   Kathlen Mody, Cadence H, PA-C  Solmon Ice, RN Labs relatively unremarkable. Alk phose very mildly elevated. Can have PCP re-check at some point, not urgent.   Lipid profile LDL good at 59, HDL (good cholesterol) low at 31. Encourage eating good fats (avocado, nuts, etc)

## 2020-10-03 NOTE — Telephone Encounter (Signed)
Spoke to pt. Notified of lab results and provider's recc.  Pt verbalized understanding and will f/u with PCP regarding alk phos.  And states he will begin incorporating good fats into diet to incr HDL.  Pt has no further questions.

## 2020-12-26 NOTE — Progress Notes (Signed)
Cardiology Office Note:    Date:  12/27/2020   ID:  Alejandro, Gibson 1981-03-01, MRN [id number]  PCP:  Center, [facility] Cardiologist:  Lorine Bears, MD  [facility] Electrophysiologist:  None   Referring MD: Center, [facility]*   Chief Complaint: 3 month follow-up  History of Present Illness:    Alejandro Gibson is a 40 y.o. male with a hx of hx of nonobstructive CAD, HTN, COPD, HLD, tobacco and alcohol use who present for 6 week follow-up.    Patient was hospialized 07/2020 for NSTEMI in the setting of uncontrolled HTN. LHC showed mild to moderate nonobstructive CAD with the worst stenosis 60% ostial first diagonal, normal LVEF, elevated LVEDP. Suspected demand ischemia in the setting of uncontrolled HTN. Recommended for medical therapy. Eho showed LVEF 50-60%, no WMA, normal diastolic function.He was seen in the ED 08/08/20 for dizziness. HS trop was elevated however was improved from recent admission.    He was seen in hospital follow-up 08/09/20 and still had some dizziness, but had not taken Bps at home. BP was well controlled and all medicines were continued. Had cut back on tobacco use.   Last seen 09/20/20 and denied dizzy spells. He reported chest  burning. Did not want Imdur. Recent cath was reviewed. Labs were drawn.   Today, the patient reports he is feeling much better. He has been working [employer] and is walking a lot. He has lost about 10-15lbs. He plans on losing more weight. He has been walking on the weekends. Plans no going back to the gym. Reduced smoking to 3-4 cigarettes daily. Drinks decaf drinks. Drinking more water. Meditating in the morning. Patient had an episode of arm pain episode about a month ago, he forgot to take his medications that day. BP was found to be high. No chest pain pain. Somewhat similar to prior pain before MI, but unsure. Didn't last long, less than a minute. Pain came on when he was sitting, not  worse with exertion. No SOB, nausea, vomiting. Didn't take a NITRO. Daily walking doesn't cause arm pain.   Past Medical History:  Diagnosis Date   Alcohol use    COPD (chronic obstructive pulmonary disease) (HCC)    Coronary artery disease    (a) echo 07/2020 prox-mid LAD 30% stenosed, 1st diag lesion 60%, mid-dist Cx 30%, ramus lesion 30%, EF 55-65%   Essential hypertension    Hyperlipidemia    Tobacco use     Past Surgical History:  Procedure Laterality Date   HAND SURGERY     ORIF right ring finger   KNEE SURGERY     arthroscopy right knee   LEFT HEART CATH AND CORONARY ANGIOGRAPHY N/A 08/02/2020   Procedure: LEFT HEART CATH AND CORONARY ANGIOGRAPHY;  Surgeon: Iran Ouch, MD;  Location: [facility] INVASIVE CV LAB;  Service: Cardiovascular;  Laterality: N/A;    Current Medications: Current Meds  Medication Sig   amLODipine (NORVASC) 10 MG tablet Take 1 tablet (10 mg total) by mouth daily.   aspirin EC 81 MG EC tablet Take 1 tablet (81 mg total) by mouth daily. Swallow whole.   atorvastatin (LIPITOR) 80 MG tablet Take 1 tablet (80 mg total) by mouth at bedtime.   carvedilol (COREG) 12.5 MG tablet Take 1 tablet (12.5 mg total) by mouth 2 (two) times daily with a meal.   folic acid (FOLVITE) 1 MG tablet Take 1 tablet (1 mg total) by mouth daily.   methocarbamol (ROBAXIN)  500 MG tablet 1-2 tablets qid prn muscle spasms.   Multiple Vitamin (MULTIVITAMIN WITH MINERALS) TABS tablet Take 1 tablet by mouth daily.   nitroGLYCERIN (NITROSTAT) 0.4 MG SL tablet Place 1 tablet (0.4 mg total) under the tongue every 5 (five) minutes as needed for chest pain.   PROAIR HFA 108 (90 Base) MCG/ACT inhaler Inhale 1-2 puffs into the lungs every 4 (four) hours as needed.     Allergies:   Penicillins, Vicodin [hydrocodone-acetaminophen], and Hydrocodone   Social History   Socioeconomic History   Marital status: Married    Spouse name: Not on file   Number of children: Not on file   Years of  education: Not on file   Highest education level: Not on file  Occupational History   Not on file  Tobacco Use   Smoking status: Light Smoker    Packs/day: 0.25    Types: Cigarettes    Last attempt to quit: 07/31/2020    Years since quitting: 0.4   Smokeless tobacco: Current    Types: Snuff  Vaping Use   Vaping Use: Never used  Substance and Sexual Activity   Alcohol use: Yes    Alcohol/week: 1.0 standard drink    Types: 1 Cans of beer per week   Drug use: Yes    Types: Marijuana   Sexual activity: Not on file  Other Topics Concern   Not on file  Social History Narrative   Not on file   Social Determinants of Health   Financial Resource Strain: Not on file  Food Insecurity: Not on file  Transportation Needs: Not on file  Physical Activity: Not on file  Stress: Not on file  Social Connections: Not on file     Family History: The patient's family history includes CAD in his father and paternal grandfather; Colon cancer in his mother.  ROS:   Please see the history of present illness.     All other systems reviewed and are negative.  EKGs/Labs/Other Studies Reviewed:    The following studies were reviewed today:  Echo 08/03/20  1. Left ventricular ejection fraction, by estimation, is 55 to 60%. The  left ventricle has normal function. The left ventricle has no regional  wall motion abnormalities. Left ventricular diastolic parameters were  normal.   2. Right ventricular systolic function is normal. The right ventricular  size is normal.   3. The mitral valve is normal in structure. No evidence of mitral valve  regurgitation.   4. The aortic valve was not well visualized. Aortic valve regurgitation  is not visualized.   5. The inferior vena cava is normal in size with greater than 50%  respiratory variability, suggesting right atrial pressure of 3 mmHg.    Cardiac cath 08/02/20   The left ventricular systolic function is normal. LV end diastolic pressure is  mildly elevated. The left ventricular ejection fraction is 55-65% by visual estimate. Prox LAD to Mid LAD lesion is 30% stenosed. 1st Diag lesion is 60% stenosed. Mid Cx to Dist Cx lesion is 30% stenosed. Ramus lesion is 30% stenosed.   1.  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  Worst stenosis is 60% in ostial first diagonal. 2.  Normal LV systolic function mildly elevated left ventricular end-diastolic pressure.   Recommendations: Suspect supply demand ischemia in the setting of uncontrolled hypertension. Recommend aggressive medical therapy for nonobstructive coronary artery disease, smoking cessation and blood pressure control. I added carvedilol and amlodipine for blood pressure control. I also  added atorvastatin.  EKG:  EKG is ordered today.  The ekg ordered today demonstrates NSR, 62bpm, TWI aVL, no sign cahnge  Recent Labs: 08/02/2020: Magnesium 2.2 08/08/2020: B Natriuretic Peptide 28.1; Hemoglobin 16.2; Platelets 296 09/20/2020: ALT 39; BUN 9; Creatinine, Ser 1.07; Potassium 4.6; Sodium 141  Recent Lipid Panel    Component Value Date/Time   CHOL 105 09/20/2020 0850   TRIG 71 09/20/2020 0850   HDL 31 (L) 09/20/2020 0850   CHOLHDL 3.4 09/20/2020 0850   CHOLHDL 6.5 08/03/2020 0453   VLDL 33 08/03/2020 0453   LDLCALC 59 09/20/2020 0850     Physical Exam:    VS:  BP 118/90 (BP Location: Left Arm, Patient Position: Sitting, Cuff Size: Normal)   Pulse 62   Ht 5\' 11"  (1.803 m)   Wt 250 lb 8 oz (113.6 kg)   BMI 34.94 kg/m     Wt Readings from Last 3 Encounters:  12/27/20 250 lb 8 oz (113.6 kg)  09/20/20 247 lb 8 oz (112.3 kg)  08/09/20 245 lb (111.1 kg)     GEN:  Well nourished, well developed in no acute distress HEENT: Normal NECK: No JVD; No carotid bruits LYMPHATICS: No lymphadenopathy CARDIAC: RRR, no murmurs, rubs, gallops RESPIRATORY: +wheezing  ABDOMEN: Soft, non-tender, non-distended MUSCULOSKELETAL:  No edema; No deformity  SKIN: Warm  and dry NEUROLOGIC:  Alert and oriented x 3 PSYCHIATRIC:  Normal affect   ASSESSMENT:    1. Hypertensive urgency   2. Coronary artery disease of native artery of native heart with stable angina pectoris (HCC)   3. Essential hypertension   4. Hyperlipidemia LDL goal <70   5. Tobacco use    PLAN:    In order of problems listed above:  Nonobstructive CAD with stable angina One month ago the patient had an episode of arm pain, not clearly anginal pain. Patient had forgotten to take his medications that day and BP was high. Otherwise, patient takes his medications and has no daily angina on exertion. He has made significant lifestyle changes. He has changed his diet and lost weight. He plans to continue to lose wight. He has decreased alcohol and tobacco use. He is much more active, every day at work, and he walks on the weekends. He has no anginal symptoms with this. Cath earlier this year reviewed, showing mild to moderate disease, worst stenosis 60% ostial 1st diag. EKG with no ischemic changes. Treadmill test vs Imdur was discussed. However, given cath as well as lack of daily symptoms on exertion, plan to wait at this time on further testing. Also arm pain could have been from elevated BP given that he forgot his medications that day. Patient is agreeable to plan. We will reevaluate symptoms at follow-up.   HTN BP normotensive. Continue current medications  HLD Will request lipid panel from PCP. Continue Atorvastatin.  Tobacco use Has decreased to 3-4 cigarettes daily. Complete cessation advised.   Disposition: Follow up in 3 month(s) with MD/APP    Signed, Kenidee Cregan 08/11/20, PA-C  12/27/2020 9:06 AM    [facility]

## 2020-12-27 ENCOUNTER — Encounter: Payer: Self-pay | Admitting: Medical

## 2020-12-27 ENCOUNTER — Other Ambulatory Visit: Payer: Self-pay

## 2020-12-27 ENCOUNTER — Ambulatory Visit (INDEPENDENT_AMBULATORY_CARE_PROVIDER_SITE_OTHER): Payer: Medicaid Other | Admitting: Medical

## 2020-12-27 VITALS — BP 118/90 | HR 62 | Ht 71.0 in | Wt 250.5 lb

## 2020-12-27 DIAGNOSIS — I25118 Atherosclerotic heart disease of native coronary artery with other forms of angina pectoris: Secondary | ICD-10-CM

## 2020-12-27 DIAGNOSIS — I160 Hypertensive urgency: Secondary | ICD-10-CM

## 2020-12-27 DIAGNOSIS — I10 Essential (primary) hypertension: Secondary | ICD-10-CM | POA: Diagnosis not present

## 2020-12-27 DIAGNOSIS — E785 Hyperlipidemia, unspecified: Secondary | ICD-10-CM | POA: Diagnosis not present

## 2020-12-27 DIAGNOSIS — Z720 Tobacco use: Secondary | ICD-10-CM

## 2020-12-27 NOTE — Patient Instructions (Signed)
Medication Instructions:  Please continue your current medications  *If you need a refill on your cardiac medications before your next appointment, please call your pharmacy*   Lab Work: We will request your lipid panel from [facility]  Testing/Procedures: None   Follow-Up: At [facility], you and your health needs are our priority.  As part of our continuing mission to provide you with exceptional heart care, we have created designated Provider Care Teams.  These Care Teams include your primary Cardiologist (physician) and Advanced Practice Providers (APPs -  Physician Assistants and Nurse Practitioners) who all work together to provide you with the care you need, when you need it.  We recommend signing up for the patient portal called "MyChart".  Sign up information is provided on this After Visit Summary.  MyChart is used to connect with patients for Virtual Visits (Telemedicine).  Patients are able to view lab/test results, encounter notes, upcoming appointments, etc.  Non-urgent messages can be sent to your provider as well.   To learn more about what you can do with MyChart, go to [email or web address].    Your next appointment:   3 month(s)  The format for your next appointment:   In Person  Provider:   Cadence Fransico Michael, PA-C

## 2021-04-04 ENCOUNTER — Ambulatory Visit: Payer: Medicaid Other | Admitting: Medical

## 2021-04-04 NOTE — Progress Notes (Deleted)
Cardiology Office Note:    Date:  04/04/2021   ID:  Taym, Twist 1981-04-11, MRN [id number]  PCP:  Center, [facility] Cardiologist:  Lorine Bears, MD  [facility] Electrophysiologist:  None   Referring MD: Center, [facility]*   Chief Complaint: follow-up  History of Present Illness:    Alejandro Gibson is a 40 y.o. male with a hx of  hx of hx of nonobstructive CAD, HTN, COPD, HLD, tobacco and alcohol use who present for 6 week follow-up.    Patient was hospialized 07/2020 for NSTEMI in the setting of uncontrolled HTN. LHC showed mild to moderate nonobstructive CAD with the worst stenosis 60% ostial first diagonal, normal LVEF, elevated LVEDP. Suspected demand ischemia in the setting of uncontrolled HTN. Recommended for medical therapy. Eho showed LVEF 50-60%, no WMA, normal diastolic function.He was seen in the ED 08/08/20 for dizziness. HS trop was elevated however was improved from recent admission.    He was seen in hospital follow-up 08/09/20 and still had some dizziness, but had not taken Bps at home. BP was well controlled and all medicines were continued. Had cut back on tobacco use.  Last seen 12/27/20 and was doing well, changed lifestyle and lost weight. Had an episode of arm pain, further testing deferred.   Today,    Past Medical History:  Diagnosis Date   Alcohol use    COPD (chronic obstructive pulmonary disease) (HCC)    Coronary artery disease    (a) echo 07/2020 prox-mid LAD 30% stenosed, 1st diag lesion 60%, mid-dist Cx 30%, ramus lesion 30%, EF 55-65%   Essential hypertension    Hyperlipidemia    Tobacco use     Past Surgical History:  Procedure Laterality Date   HAND SURGERY     ORIF right ring finger   KNEE SURGERY     arthroscopy right knee   LEFT HEART CATH AND CORONARY ANGIOGRAPHY N/A 08/02/2020   Procedure: LEFT HEART CATH AND CORONARY ANGIOGRAPHY;  Surgeon: Iran Ouch, MD;  Location: [facility]  INVASIVE CV LAB;  Service: Cardiovascular;  Laterality: N/A;    Current Medications: No outpatient medications have been marked as taking for the 04/04/21 encounter (Appointment) with Fransico Michael, Cara Thaxton H, PA-C.     Allergies:   Penicillins, Vicodin [hydrocodone-acetaminophen], and Hydrocodone   Social History   Socioeconomic History   Marital status: Married    Spouse name: Not on file   Number of children: Not on file   Years of education: Not on file   Highest education level: Not on file  Occupational History   Not on file  Tobacco Use   Smoking status: Light Smoker    Packs/day: 0.25    Types: Cigarettes    Last attempt to quit: 07/31/2020    Years since quitting: 0.6   Smokeless tobacco: Current    Types: Snuff  Vaping Use   Vaping Use: Never used  Substance and Sexual Activity   Alcohol use: Yes    Alcohol/week: 1.0 standard drink    Types: 1 Cans of beer per week   Drug use: Yes    Types: Marijuana   Sexual activity: Not on file  Other Topics Concern   Not on file  Social History Narrative   Not on file   Social Determinants of Health   Financial Resource Strain: Not on file  Food Insecurity: Not on file  Transportation Needs: Not on file  Physical Activity: Not on file  Stress: Not on file  Social Connections: Not on file     Family History: The patient's family history includes CAD in his father and paternal grandfather; Colon cancer in his mother.  ROS:   Please see the history of present illness.     All other systems reviewed and are negative.  EKGs/Labs/Other Studies Reviewed:    The following studies were reviewed today:  Echo 08/03/20  1. Left ventricular ejection fraction, by estimation, is 55 to 60%. The  left ventricle has normal function. The left ventricle has no regional  wall motion abnormalities. Left ventricular diastolic parameters were  normal.   2. Right ventricular systolic function is normal. The right ventricular  size is  normal.   3. The mitral valve is normal in structure. No evidence of mitral valve  regurgitation.   4. The aortic valve was not well visualized. Aortic valve regurgitation  is not visualized.   5. The inferior vena cava is normal in size with greater than 50%  respiratory variability, suggesting right atrial pressure of 3 mmHg.    Cardiac cath 08/02/20   The left ventricular systolic function is normal. LV end diastolic pressure is mildly elevated. The left ventricular ejection fraction is 55-65% by visual estimate. Prox LAD to Mid LAD lesion is 30% stenosed. 1st Diag lesion is 60% stenosed. Mid Cx to Dist Cx lesion is 30% stenosed. Ramus lesion is 30% stenosed.   1.  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  Worst stenosis is 60% in ostial first diagonal. 2.  Normal LV systolic function mildly elevated left ventricular end-diastolic pressure.   Recommendations: Suspect supply demand ischemia in the setting of uncontrolled hypertension. Recommend aggressive medical therapy for nonobstructive coronary artery disease, smoking cessation and blood pressure control. I added carvedilol and amlodipine for blood pressure control. I also added atorvastatin.  EKG:  EKG is *** ordered today.  The ekg ordered today demonstrates ***  Recent Labs: 08/02/2020: Magnesium 2.2 08/08/2020: B Natriuretic Peptide 28.1; Hemoglobin 16.2; Platelets 296 09/20/2020: ALT 39; BUN 9; Creatinine, Ser 1.07; Potassium 4.6; Sodium 141  Recent Lipid Panel    Component Value Date/Time   CHOL 105 09/20/2020 0850   TRIG 71 09/20/2020 0850   HDL 31 (L) 09/20/2020 0850   CHOLHDL 3.4 09/20/2020 0850   CHOLHDL 6.5 08/03/2020 0453   VLDL 33 08/03/2020 0453   LDLCALC 59 09/20/2020 0850     Risk Assessment/Calculations:   {Does this patient have ATRIAL FIBRILLATION?[phone]}   Physical Exam:    VS:  There were no vitals taken for this visit.    Wt Readings from Last 3 Encounters:  12/27/20  250 lb 8 oz (113.6 kg)  09/20/20 247 lb 8 oz (112.3 kg)  08/09/20 245 lb (111.1 kg)     GEN: *** Well nourished, well developed in no acute distress HEENT: Normal NECK: No JVD; No carotid bruits LYMPHATICS: No lymphadenopathy CARDIAC: ***RRR, no murmurs, rubs, gallops RESPIRATORY:  Clear to auscultation without rales, wheezing or rhonchi  ABDOMEN: Soft, non-tender, non-distended MUSCULOSKELETAL:  No edema; No deformity  SKIN: Warm and dry NEUROLOGIC:  Alert and oriented x 3 PSYCHIATRIC:  Normal affect   ASSESSMENT:    No diagnosis found. PLAN:    In order of problems listed above:  Nonobstructive CAD with stable angina  HTN  HLD  Tobacco use  Disposition: Follow up {follow up:15908} with ***   Shared Decision Making/Informed Consent   {Are you ordering a CV Procedure (e.g. stress test,  cath, DCCV, TEE, etc)?   Press F2        :[id number]}    Signed, Margalit Leece David Stall, PA-C  04/04/2021 7:32 AM    [facility]

## 2021-04-07 ENCOUNTER — Encounter: Payer: Self-pay | Admitting: Medical

## 2021-05-26 ENCOUNTER — Telehealth: Payer: Self-pay | Admitting: Nurse Practitioner

## 2021-05-26 NOTE — Telephone Encounter (Signed)
Patient reports left arm pain primarily when he smokes. He has cut back and is almost weaned off smoking and has completely stopped drinking. Reviewed symptoms and he denies any chest pain with that arm pain. Patient stated that he wants to see if his heart should be checked out again due to blockage that was seen in previous procedure. Confirmed appointment in 2 days here in our office. Reviewed ED precautions and he verbalized understanding with no further questions.

## 2021-05-26 NOTE — Telephone Encounter (Signed)
Patient calling for overdue visit and to address   Pt c/o Shortness Of Breath: STAT if SOB developed within the last 24 hours or pt is noticeably SOB on the phone  1. Are you currently SOB (can you hear that pt is SOB on the phone)? no  2. How long have you been experiencing SOB? Intermittent contributes to smoking   3. Are you SOB when sitting or when up moving around? Exertion   4. Are you currently experiencing any other symptoms? Left arm pain when smoking is main complaint .    Patient scheduled 1/11 berge at 915 to discuss .

## 2021-05-28 ENCOUNTER — Ambulatory Visit: Payer: Medicaid Other | Admitting: Nurse Practitioner

## 2022-02-11 IMAGING — CR DG CHEST 2V
1 series · 2 of 2 positions shown · non-contrast
Comparison: August 14, 2017

CLINICAL DATA: Chest pain.

EXAM:
Chest-two view

[Series 1: w chest pa · 0.14mm/px · 2 of 2 slices shown]
[im 1/2]
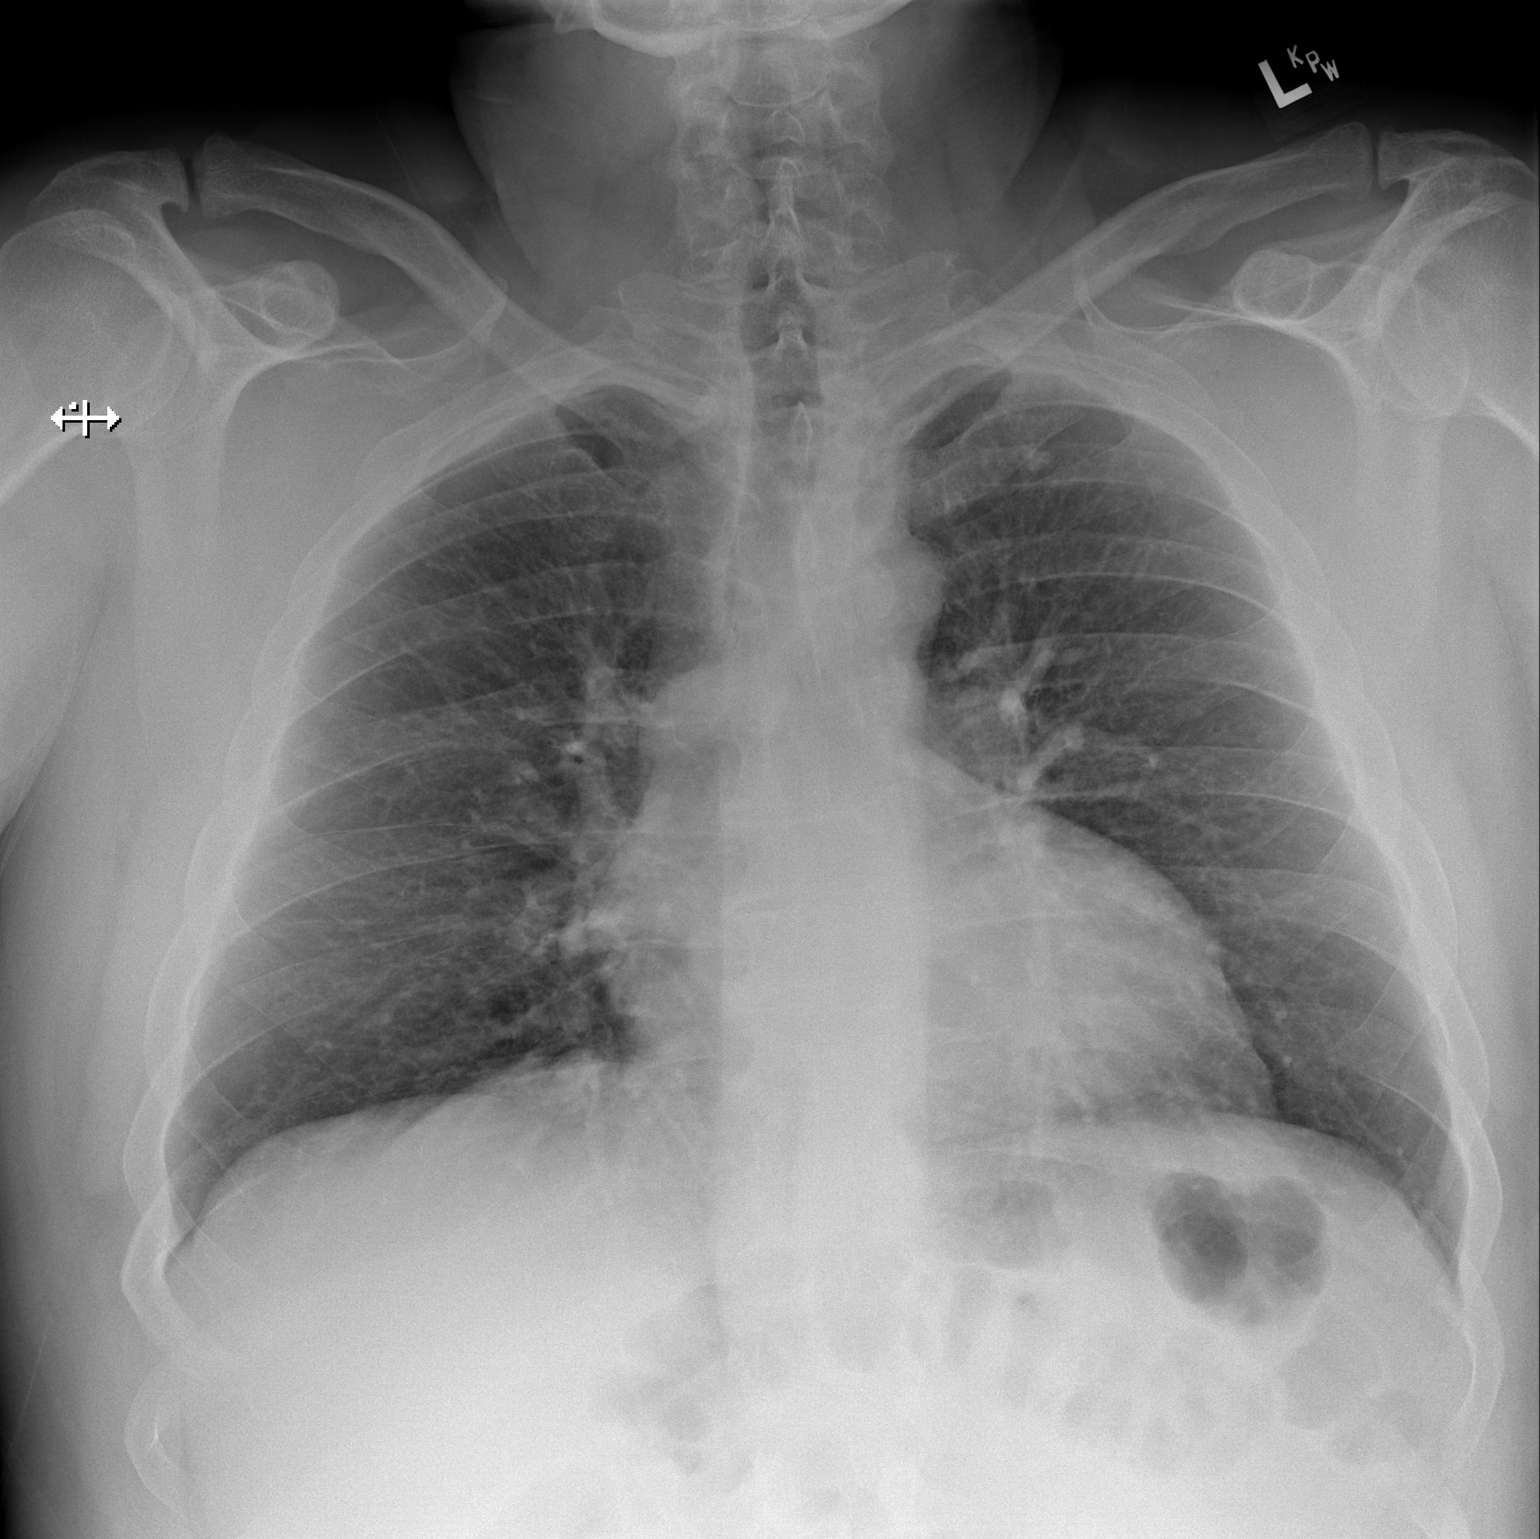
[im 2/2]
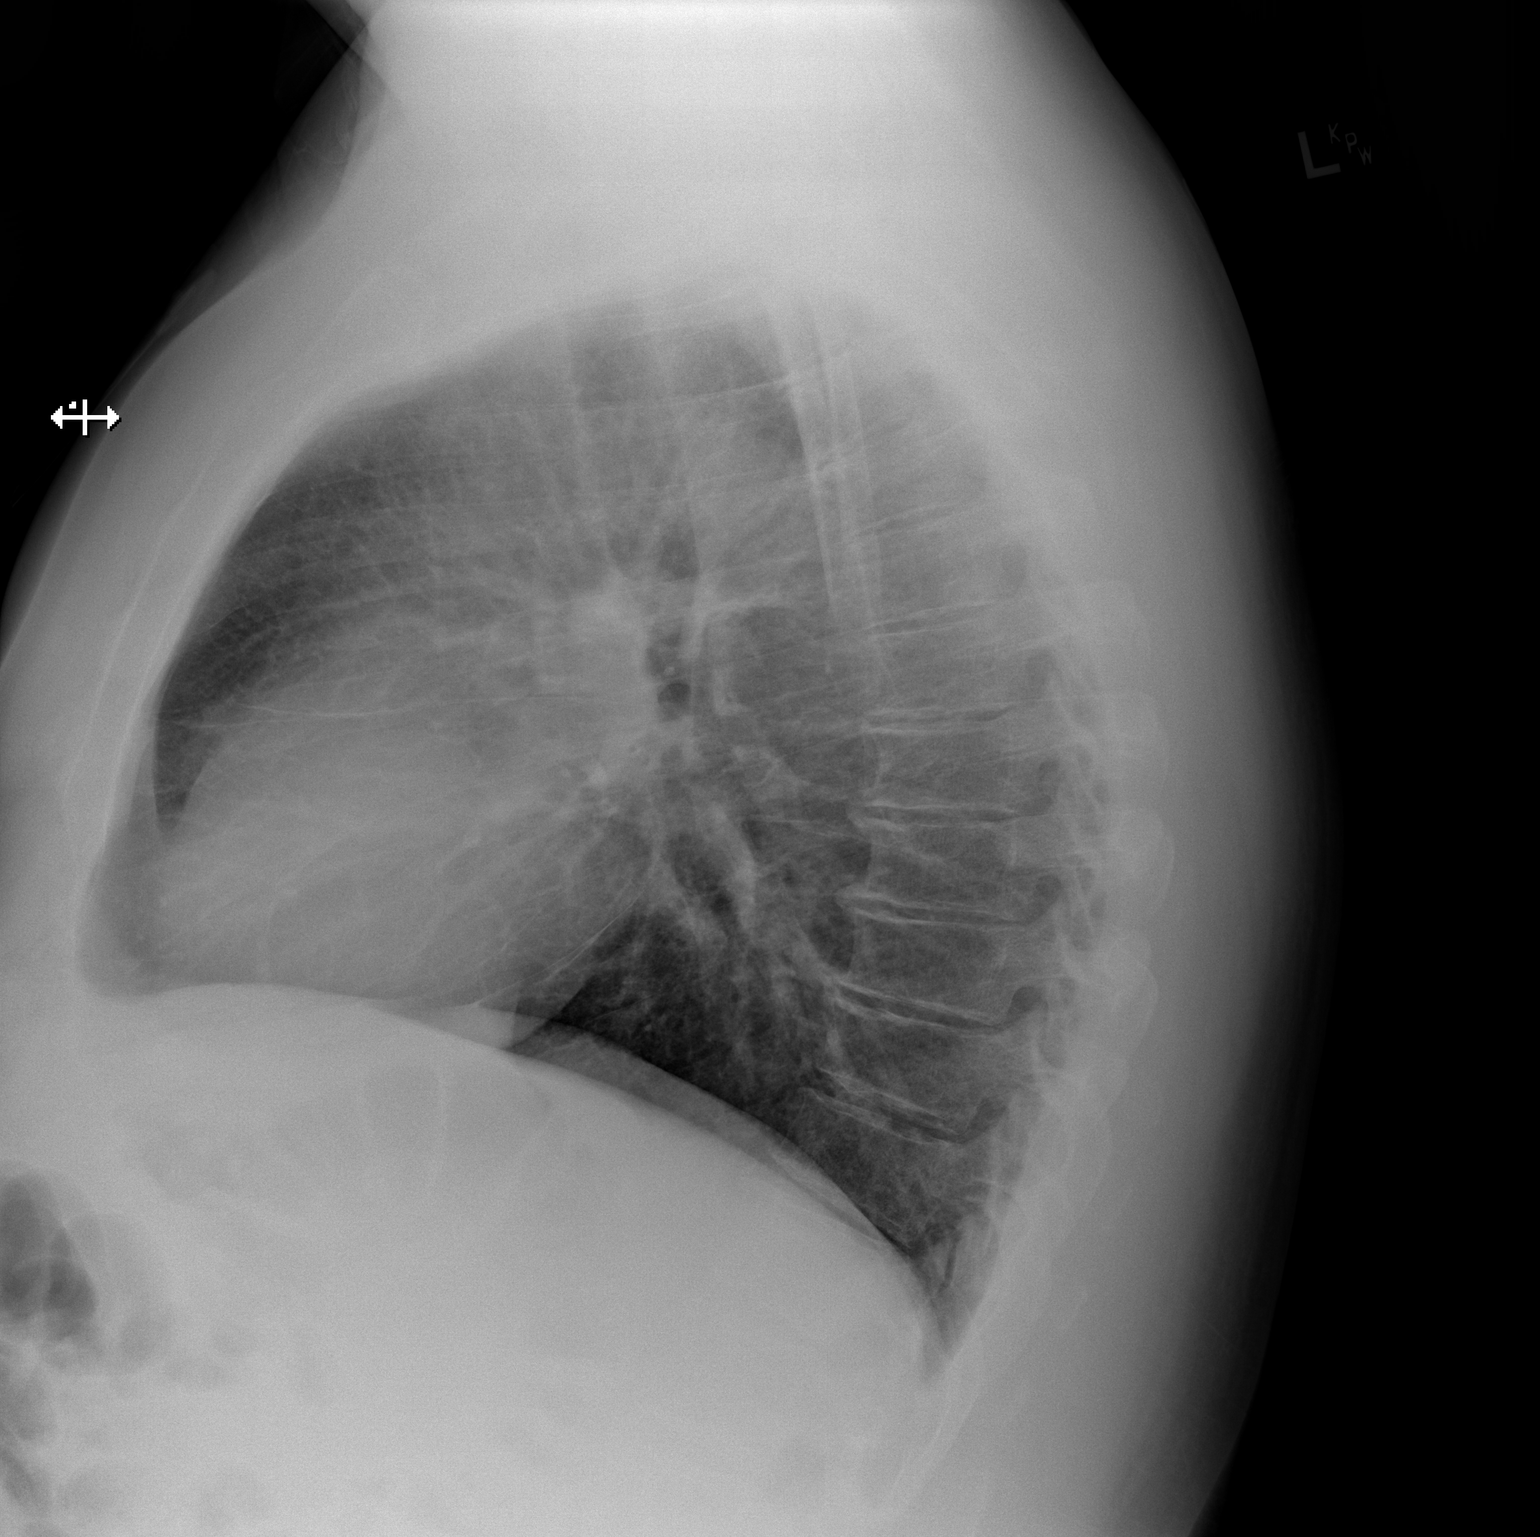

[2 of 2 positions shown; findings below may reference images not displayed]

FINDINGS: No consolidation. No visible pleural effusions or pneumothorax.
Cardiomediastinal silhouette is within normal limits. Approximately
6 mm nodular opacity in the left upper lung, likely the calcified
nodule/granuloma seen on prior CT chest.
IMPRESSION: 1. No evidence of acute cardiopulmonary disease.
2. Approximately 6 mm nodular opacity in the left upper lung, likely
the calcified nodule/granuloma seen on prior CT chest.

## 2022-10-24 ENCOUNTER — Encounter: Payer: Self-pay | Admitting: Emergency Medicine

## 2022-10-24 ENCOUNTER — Other Ambulatory Visit: Payer: Self-pay

## 2022-10-24 ENCOUNTER — Emergency Department: Payer: Medicaid Other

## 2022-10-24 ENCOUNTER — Emergency Department
Admission: EM | Admit: 2022-10-24 | Discharge: 2022-10-24 | Disposition: A | Discharge status: Home / Self Care | Payer: Medicaid Other | Attending: Emergency Medicine | Admitting: Emergency Medicine

## 2022-10-24 DIAGNOSIS — F17200 Nicotine dependence, unspecified, uncomplicated: Secondary | ICD-10-CM | POA: Diagnosis not present

## 2022-10-24 DIAGNOSIS — R509 Fever, unspecified: Secondary | ICD-10-CM | POA: Insufficient documentation

## 2022-10-24 DIAGNOSIS — J441 Chronic obstructive pulmonary disease with (acute) exacerbation: Secondary | ICD-10-CM | POA: Diagnosis not present

## 2022-10-24 DIAGNOSIS — R0602 Shortness of breath: Secondary | ICD-10-CM | POA: Diagnosis present

## 2022-10-24 DIAGNOSIS — I10 Essential (primary) hypertension: Secondary | ICD-10-CM | POA: Insufficient documentation

## 2022-10-24 DIAGNOSIS — I2510 Atherosclerotic heart disease of native coronary artery without angina pectoris: Secondary | ICD-10-CM | POA: Diagnosis not present

## 2022-10-24 LAB — TROPONIN I (HIGH SENSITIVITY)
Troponin I (High Sensitivity): 12 ng/L (ref <18)
Troponin I (High Sensitivity): 10 ng/L (ref <18)

## 2022-10-24 LAB — CBC WITH DIFFERENTIAL/PLATELET
Abs Immature Granulocytes: 0.01 K/uL (ref 0.00–0.07)
Basophils Absolute: 0 K/uL (ref 0.0–0.1)
Basophils Relative: 1 %
Eosinophils Absolute: 0 K/uL (ref 0.0–0.5)
Eosinophils Relative: 0 %
HCT: 48.4 % (ref 39.0–52.0)
Hemoglobin: 15.9 g/dL (ref 13.0–17.0)
Immature Granulocytes: 0 %
Lymphocytes Relative: 19 %
Lymphs Abs: 0.9 K/uL (ref 0.7–4.0)
MCH: 29.8 pg (ref 26.0–34.0)
MCHC: 32.9 g/dL (ref 30.0–36.0)
MCV: 90.8 fL (ref 80.0–100.0)
Monocytes Absolute: 0.6 K/uL (ref 0.1–1.0)
Monocytes Relative: 12 %
Neutro Abs: 3.2 K/uL (ref 1.7–7.7)
Neutrophils Relative %: 68 %
Platelets: 141 K/uL — ABNORMAL LOW (ref 150–400)
RBC: 5.33 MIL/uL (ref 4.22–5.81)
RDW: 13.2 % (ref 11.5–15.5)
WBC: 4.8 K/uL (ref 4.0–10.5)
nRBC: 0 % (ref 0.0–0.2)

## 2022-10-24 LAB — COMPREHENSIVE METABOLIC PANEL WITH GFR
ALT: 50 U/L — ABNORMAL HIGH (ref 0–44)
AST: 42 U/L — ABNORMAL HIGH (ref 15–41)
Albumin: 3.9 g/dL (ref 3.5–5.0)
Alkaline Phosphatase: 85 U/L (ref 38–126)
Anion gap: 13 (ref 5–15)
BUN: 14 mg/dL (ref 6–20)
CO2: 19 mmol/L — ABNORMAL LOW (ref 22–32)
Calcium: 8.2 mg/dL — ABNORMAL LOW (ref 8.9–10.3)
Chloride: 102 mmol/L (ref 98–111)
Creatinine, Ser: 1.04 mg/dL (ref 0.61–1.24)
GFR, Estimated: >60 mL/min
Glucose, Bld: 114 mg/dL — ABNORMAL HIGH (ref 70–99)
Potassium: 3.9 mmol/L (ref 3.5–5.1)
Sodium: 134 mmol/L — ABNORMAL LOW (ref 135–145)
Total Bilirubin: 0.8 mg/dL (ref 0.3–1.2)
Total Protein: 7.2 g/dL (ref 6.5–8.1)

## 2022-10-24 MED ORDER — PREDNISONE 20 MG PO TABS
60.0000 mg | ORAL_TABLET | Freq: Once | ORAL | Status: AC
  Administered 2022-10-24: 60 mg via ORAL
  Filled 2022-10-24: qty 3

## 2022-10-24 MED ORDER — IPRATROPIUM-ALBUTEROL 0.5-2.5 (3) MG/3ML IN SOLN
6.0000 mL | Freq: Once | RESPIRATORY_TRACT | Status: AC
  Administered 2022-10-24: 6 mL via RESPIRATORY_TRACT
  Filled 2022-10-24: qty 6

## 2022-10-24 MED ORDER — DOXYCYCLINE HYCLATE 100 MG PO TABS: 100.0000 mg | ORAL_TABLET | Freq: Two times a day (BID) | ORAL | 0 refills | Status: AC

## 2022-10-24 MED ORDER — DOXYCYCLINE HYCLATE 100 MG PO TABS
100.0000 mg | ORAL_TABLET | Freq: Once | ORAL | Status: AC
  Administered 2022-10-24: 100 mg via ORAL
  Filled 2022-10-24: qty 1

## 2022-10-24 MED ORDER — ALBUTEROL SULFATE HFA 108 (90 BASE) MCG/ACT IN AERS: 2.0000 | INHALATION_SPRAY | Freq: Four times a day (QID) | RESPIRATORY_TRACT | 2 refills | Status: AC | PRN

## 2022-10-24 MED ORDER — ACETAMINOPHEN 500 MG PO TABS
1000.0000 mg | ORAL_TABLET | Freq: Once | ORAL | Status: AC
  Administered 2022-10-24: 1000 mg via ORAL
  Filled 2022-10-24: qty 2

## 2022-10-24 MED ORDER — PREDNISONE 50 MG PO TABS: 50.0000 mg | ORAL_TABLET | Freq: Every day | ORAL | 0 refills | Status: AC

## 2022-10-24 MED ORDER — IPRATROPIUM-ALBUTEROL 0.5-2.5 (3) MG/3ML IN SOLN
6.0000 mL | Freq: Once | RESPIRATORY_TRACT | Status: AC
  Administered 2022-10-24: 6 mL via RESPIRATORY_TRACT
  Filled 2022-10-24: qty 3

## 2022-10-24 NOTE — Discharge Instructions (Signed)
3 prescriptions to the pharmacy:  Albuterol inhaler to use 1-2 puffs at a time, every 4-6 hours as needed Doxycycline antibiotic, twice daily for 5 days Prednisone steroid, once daily for 5 days

## 2022-10-24 NOTE — ED Notes (Signed)
Pt agreeable to medications - see MAR for intervention.

## 2022-10-24 NOTE — ED Triage Notes (Addendum)
Pt presents ambulatory to triage via POV with complaints of SOB x 2 days. Recently quit smoking and endorses a cough and pain in his upper back. Declines a COVID swab stating " that shits a complete scam" and doesn't want meds until I see a doctor, however has some nasal congestion and cold chills. No meds taken PTA. A&Ox4 at this time. Denies CP.

## 2022-10-24 NOTE — ED Provider Notes (Signed)
[facility] Provider Note    Event Date/Time   First MD Initiated Contact with Patient 10/24/22 [phone]     (approximate)   History   Shortness of Breath   HPI  Alejandro Gibson is a 42 y.o. male who presents to the ED for evaluation of Shortness of Breath   I review a cardiology clinic visit from nearly 2 years ago.  Tobacco abuse, nonobstructive CAD, HTN and COPD.  Left heart cath 2 years ago, moderate nonobstructive CAD managed medically.  Patient presents for evaluation of a few days of worsening dyspnea, productive cough and subjective chills and fevers.  No syncope or falls.  No chest pain.  No abdominal pain or emesis   Physical Exam   Triage Vital Signs: ED Triage Vitals  Enc Vitals Group     BP 10/24/22 0343 (!) 173/107     Pulse Rate 10/24/22 0343 98     Resp 10/24/22 0343 17     Temp 10/24/22 0343 (!) 100.9 F (38.3 C)     Temp Source 10/24/22 0343 Oral     SpO2 10/24/22 0343 94 %     Weight 10/24/22 0338 230 lb (104.3 kg)     Height 10/24/22 0338 5\' 11"  (1.803 m)     Head Circumference --      Peak Flow --      Pain Score 10/24/22 0339 6     Pain Loc --      Pain Edu? --      Excl. in GC? --     Most recent vital signs: Vitals:   10/24/22 0343 10/24/22 0614  BP: (!) 173/107 (!) 145/97  Pulse: 98 79  Resp: 17 20  Temp: (!) 100.9 F (38.3 C) 98.6 F (37 C)  SpO2: 94% 97%    General: Awake, no distress.  Speaking in full sentences CV:  Good peripheral perfusion.  Resp:  Minimal tachypnea to the low 20s.  No distress.  Wheezing throughout with slightly decreased airflow Abd:  No distention.  MSK:  No deformity noted.  Neuro:  No focal deficits appreciated. Other:     ED Results / Procedures / Treatments   Labs (all labs ordered are listed, but only abnormal results are displayed) Labs Reviewed  COMPREHENSIVE METABOLIC PANEL - Abnormal; Notable for the following components:      Result Value   Sodium 134 (*)     CO2 19 (*)    Glucose, Bld 114 (*)    Calcium 8.2 (*)    AST 42 (*)    ALT 50 (*)    All other components within normal limits  CBC WITH DIFFERENTIAL/PLATELET - Abnormal; Notable for the following components:   Platelets 141 (*)    All other components within normal limits  TROPONIN I (HIGH SENSITIVITY)  TROPONIN I (HIGH SENSITIVITY)    EKG Sinus rhythm with a rate of 91 bpm.  Normal axis and intervals.  No clear signs of acute ischemia.  RADIOLOGY CXR interpreted by me without evidence of acute cardiopulmonary pathology.  Official radiology report(s): DG Chest 2 View  Result Date: 10/24/2022 CLINICAL DATA:  Cough, cold, congestion EXAM: CHEST - 2 VIEW COMPARISON:  08/08/2020 FINDINGS: Normal heart size and mediastinal contours. No acute infiltrate or edema. Calcified granuloma at the left apex. No effusion or pneumothorax. No acute osseous findings. IMPRESSION: No active cardiopulmonary disease. Electronically Signed   By: Tiburcio Pea M.D.   On: 10/24/2022 04:23  PROCEDURES and INTERVENTIONS:  .1-3 Lead EKG Interpretation  Performed by: Delton Prairie, MD Authorized by: Delton Prairie, MD     Interpretation: normal     ECG rate:  80   ECG rate assessment: normal     Rhythm: sinus rhythm     Ectopy: none     Conduction: normal     Medications  ipratropium-albuterol (DUONEB) 0.5-2.5 (3) MG/3ML nebulizer solution 6 mL (has no administration in time range)  acetaminophen (TYLENOL) tablet 1,000 mg (1,000 mg Oral Given 10/24/22 0405)  doxycycline (VIBRA-TABS) tablet 100 mg (100 mg Oral Given 10/24/22 0611)  predniSONE (DELTASONE) tablet 60 mg (60 mg Oral Given 10/24/22 0611)  ipratropium-albuterol (DUONEB) 0.5-2.5 (3) MG/3ML nebulizer solution 6 mL (6 mLs Nebulization Given 10/24/22 0612)     IMPRESSION / MDM / ASSESSMENT AND PLAN / ED COURSE  I reviewed the triage vital signs and the nursing notes.  Differential diagnosis includes, but is not limited to, ACS, PTX, PNA, muscle  strain/spasm, PE, dissection, anxiety, pleural effusion, COPD exacerbation  {Patient presents with symptoms of an acute illness or injury that is potentially life-threatening.  42 year old presents with evidence of a COPD exacerbation.  His CXR is clear but his low-grade fevers and productive cough indicate antibiotics.  EKG is nonischemic and troponin is negative.  Normal CBC.  Possibly viral etiology but he refuses COVID testing.  Metabolic panel with mild non-anion gap metabolic acidosis.  Will provide treatments, get him started on doxycycline and prednisone.  We will reassess a troponin level and patient's breathing with anticipation of likely outpatient management.  Clinical Course as of 10/24/22 0710  Sat Oct 24, 2022  0709 Reassessed after DuoNeb, improved airflow.  Will provide another DuoNeb as we await a second troponin.  Should be suitable for outpatient management.  Signed out to oncoming provider [DS]    Clinical Course User Index [DS] Delton Prairie, MD     FINAL CLINICAL IMPRESSION(S) / ED DIAGNOSES   Final diagnoses:  COPD exacerbation (HCC)     Rx / DC Orders   ED Discharge Orders          Ordered    albuterol (VENTOLIN HFA) 108 (90 Base) MCG/ACT inhaler  Every 6 hours PRN        10/24/22 0709    predniSONE (DELTASONE) 50 MG tablet  Daily        10/24/22 0709    doxycycline (VIBRA-TABS) 100 MG tablet  2 times daily        10/24/22 0709             Note:  This document was prepared using Dragon voice recognition software and may include unintentional dictation errors.   Delton Prairie, MD 10/24/22 [phone]

## 2022-10-24 NOTE — ED Provider Notes (Signed)
8:16 AM pending repeat trop and if negative then dc home.  Repeat trop is negative.  8:38 AM repeat evaluation patient sitting up on the edge of the bed stating that he is ready for discharge home.  Updated on reassuring troponin.  He reports feeling much better feels comfortable with the plan of discharge.  He is got no issues with breathing and looks very comfortable at this time.  Discharge instructions and discharge medications were already ordered by Dr. Katrinka Blazing.     Concha Se, MD 10/24/22 [phone]

## 2022-12-01 ENCOUNTER — Institutional Professional Consult (permissible substitution): Payer: Medicaid Other | Admitting: Student in an Organized Health Care Education/Training Program

## 2022-12-21 ENCOUNTER — Institutional Professional Consult (permissible substitution): Payer: Medicaid Other | Admitting: Pulmonary Disease

## 2023-01-19 ENCOUNTER — Ambulatory Visit: Payer: MEDICAID | Attending: Cardiovascular Disease | Admitting: Cardiovascular Disease

## 2023-01-19 NOTE — Progress Notes (Deleted)
Cardiology Office Note   Date:  01/19/2023   ID:  Montanna, Hamrock August 27, 1980, MRN [id number]  PCP:  [facility]  Cardiologist:   Lorine Bears, MD   No chief complaint on file.     History of Present Illness: Alejandro Gibson is a 42 y.o. male who presents for follow up visit regarding mild nonobstructive coronary artery disease. , COPD, hyperlipidemia, tobacco use and use.    Past Medical History:  Diagnosis Date   Alcohol use    COPD (chronic obstructive pulmonary disease) (HCC)    Coronary artery disease    (a) echo 07/2020 prox-mid LAD 30% stenosed, 1st diag lesion 60%, mid-dist Cx 30%, ramus lesion 30%, EF 55-65%   Essential hypertension    Hyperlipidemia    Tobacco use     Past Surgical History:  Procedure Laterality Date   HAND SURGERY     ORIF right ring finger   KNEE SURGERY     arthroscopy right knee   LEFT HEART CATH AND CORONARY ANGIOGRAPHY N/A 08/02/2020   Procedure: LEFT HEART CATH AND CORONARY ANGIOGRAPHY;  Surgeon: Iran Ouch, MD;  Location: [facility] INVASIVE CV LAB;  Service: Cardiovascular;  Laterality: N/A;     Current Outpatient Medications  Medication Sig Dispense Refill   albuterol (VENTOLIN HFA) 108 (90 Base) MCG/ACT inhaler Inhale 2 puffs into the lungs every 6 (six) hours as needed for wheezing or shortness of breath. 8 g 2   amLODipine (NORVASC) 10 MG tablet Take 1 tablet (10 mg total) by mouth daily. 30 tablet 2   aspirin EC 81 MG EC tablet Take 1 tablet (81 mg total) by mouth daily. Swallow whole. 30 tablet 11   atorvastatin (LIPITOR) 80 MG tablet Take 1 tablet (80 mg total) by mouth at bedtime. 30 tablet 2   carvedilol (COREG) 12.5 MG tablet Take 1 tablet (12.5 mg total) by mouth 2 (two) times daily with a meal. 60 tablet 2   folic acid (FOLVITE) 1 MG tablet Take 1 tablet (1 mg total) by mouth daily. 30 tablet 0   methocarbamol (ROBAXIN) 500 MG tablet 1-2 tablets qid prn muscle spasms. 20 tablet 0    Multiple Vitamin (MULTIVITAMIN WITH MINERALS) TABS tablet Take 1 tablet by mouth daily. 30 tablet 2   nicotine (NICODERM CQ - DOSED IN MG/24 HOURS) 14 mg/24hr patch Place 1 patch (14 mg total) onto the skin daily. (Patient not taking: Reported on 12/27/2020) 28 patch 0   nitroGLYCERIN (NITROSTAT) 0.4 MG SL tablet Place 1 tablet (0.4 mg total) under the tongue every 5 (five) minutes as needed for chest pain. 30 tablet 5   PROAIR HFA 108 (90 Base) MCG/ACT inhaler Inhale 1-2 puffs into the lungs every 4 (four) hours as needed.     No current facility-administered medications for this visit.    Allergies:   Penicillins, Vicodin [hydrocodone-acetaminophen], and Hydrocodone    Social History:  The patient  reports that he has been smoking cigarettes. His smokeless tobacco use includes snuff. He reports current alcohol use of about 1.0 standard drink of alcohol per week. He reports current drug use. Drug: Marijuana.   Family History:  The patient's ***family history includes CAD in his father and paternal grandfather; Colon cancer in his mother.    ROS:  Please see the history of present illness.   Otherwise, review of systems are positive for {NONE DEFAULTED:18576}.   All other systems are reviewed and negative.    PHYSICAL  EXAM: VS:  There were no vitals taken for this visit. , BMI There is no height or weight on file to calculate BMI. GEN: Well nourished, well developed, in no acute distress  HEENT: normal  Neck: no JVD, carotid bruits, or masses Cardiac: ***RRR; no murmurs, rubs, or gallops,no edema  Respiratory:  clear to auscultation bilaterally, normal work of breathing GI: soft, nontender, nondistended, + BS MS: no deformity or atrophy  Skin: warm and dry, no rash Neuro:  Strength and sensation are intact Psych: euthymic mood, full affect   EKG:  EKG {ACTION; IS/IS WUJ:[id number]} ordered today. The ekg ordered today demonstrates ***   Recent Labs: 10/24/2022: ALT 50; BUN 14;  Creatinine, Ser 1.04; Hemoglobin 15.9; Platelets 141; Potassium 3.9; Sodium 134    Lipid Panel    Component Value Date/Time   CHOL 105 09/20/2020 0850   TRIG 71 09/20/2020 0850   HDL 31 (L) 09/20/2020 0850   CHOLHDL 3.4 09/20/2020 0850   CHOLHDL 6.5 08/03/2020 0453   VLDL 33 08/03/2020 0453   LDLCALC 59 09/20/2020 0850      Wt Readings from Last 3 Encounters:  10/24/22 230 lb (104.3 kg)  12/27/20 250 lb 8 oz (113.6 kg)  09/20/20 247 lb 8 oz (112.3 kg)      Other studies Reviewed: Additional studies/ records that were reviewed today include: ***. Review of the above records demonstrates: ***      No data to display            ASSESSMENT AND PLAN:  1.  ***    Disposition:   FU with *** in {gen number [id number]} {Days to years:10300}  Signed,  Lorine Bears, MD  01/19/2023 1:28 PM    [facility]
